# Patient Record
Sex: Male | Born: 1935
Health system: Southern US, Community
[De-identification: ages and names within clinical notes are randomized; demographics above are authoritative.]

## PROBLEM LIST (undated history)

## (undated) DIAGNOSIS — I5033 Acute on chronic diastolic (congestive) heart failure: Secondary | ICD-10-CM

## (undated) DIAGNOSIS — U071 COVID-19: Secondary | ICD-10-CM

## (undated) DIAGNOSIS — J9621 Acute and chronic respiratory failure with hypoxia: Secondary | ICD-10-CM

## (undated) DIAGNOSIS — J8410 Pulmonary fibrosis, unspecified: Secondary | ICD-10-CM

## (undated) DIAGNOSIS — I480 Paroxysmal atrial fibrillation: Secondary | ICD-10-CM

---

## 2019-10-18 ENCOUNTER — Other Ambulatory Visit (HOSPITAL_COMMUNITY): Payer: Medicare Other

## 2019-10-18 ENCOUNTER — Inpatient Hospital Stay
Admission: AD | Admit: 2019-10-18 | Discharge: 2019-12-15 | Disposition: E | Payer: Medicare Other | Source: Other Acute Inpatient Hospital | Attending: Internal Medicine | Admitting: Internal Medicine

## 2019-10-18 DIAGNOSIS — K567 Ileus, unspecified: Secondary | ICD-10-CM

## 2019-10-18 DIAGNOSIS — I509 Heart failure, unspecified: Secondary | ICD-10-CM

## 2019-10-18 DIAGNOSIS — I480 Paroxysmal atrial fibrillation: Secondary | ICD-10-CM | POA: Diagnosis present

## 2019-10-18 DIAGNOSIS — J189 Pneumonia, unspecified organism: Secondary | ICD-10-CM

## 2019-10-18 DIAGNOSIS — Z789 Other specified health status: Secondary | ICD-10-CM

## 2019-10-18 DIAGNOSIS — U071 COVID-19: Secondary | ICD-10-CM | POA: Diagnosis present

## 2019-10-18 DIAGNOSIS — R0602 Shortness of breath: Secondary | ICD-10-CM

## 2019-10-18 DIAGNOSIS — Z0189 Encounter for other specified special examinations: Secondary | ICD-10-CM

## 2019-10-18 DIAGNOSIS — R0902 Hypoxemia: Secondary | ICD-10-CM

## 2019-10-18 DIAGNOSIS — Z4659 Encounter for fitting and adjustment of other gastrointestinal appliance and device: Secondary | ICD-10-CM

## 2019-10-18 DIAGNOSIS — R109 Unspecified abdominal pain: Secondary | ICD-10-CM

## 2019-10-18 DIAGNOSIS — J9690 Respiratory failure, unspecified, unspecified whether with hypoxia or hypercapnia: Secondary | ICD-10-CM

## 2019-10-18 DIAGNOSIS — R52 Pain, unspecified: Secondary | ICD-10-CM

## 2019-10-18 DIAGNOSIS — J9621 Acute and chronic respiratory failure with hypoxia: Secondary | ICD-10-CM | POA: Diagnosis present

## 2019-10-18 DIAGNOSIS — I5033 Acute on chronic diastolic (congestive) heart failure: Secondary | ICD-10-CM | POA: Diagnosis present

## 2019-10-18 DIAGNOSIS — J8410 Pulmonary fibrosis, unspecified: Secondary | ICD-10-CM | POA: Diagnosis present

## 2019-10-18 HISTORY — DX: Acute and chronic respiratory failure with hypoxia: J96.21

## 2019-10-18 HISTORY — DX: COVID-19: U07.1

## 2019-10-18 HISTORY — DX: Pulmonary fibrosis, unspecified: J84.10

## 2019-10-18 HISTORY — DX: Paroxysmal atrial fibrillation: I48.0

## 2019-10-18 HISTORY — DX: Acute on chronic diastolic (congestive) heart failure: I50.33

## 2019-10-19 LAB — COMPREHENSIVE METABOLIC PANEL
ALT: 16 U/L (ref 0–44)
AST: 12 U/L — ABNORMAL LOW (ref 15–41)
Albumin: 2.5 g/dL — ABNORMAL LOW (ref 3.5–5.0)
Alkaline Phosphatase: 47 U/L (ref 38–126)
Anion gap: 10 (ref 5–15)
BUN: 19 mg/dL (ref 8–23)
CO2: 36 mmol/L — ABNORMAL HIGH (ref 22–32)
Calcium: 8.2 mg/dL — ABNORMAL LOW (ref 8.9–10.3)
Chloride: 93 mmol/L — ABNORMAL LOW (ref 98–111)
Creatinine, Ser: 0.73 mg/dL (ref 0.61–1.24)
GFR calc Af Amer: >60 mL/min (ref >60)
GFR calc non Af Amer: >60 mL/min (ref >60)
Glucose, Bld: 97 mg/dL (ref 70–99)
Potassium: 3.6 mmol/L (ref 3.5–5.1)
Sodium: 139 mmol/L (ref 135–145)
Total Bilirubin: 0.7 mg/dL (ref 0.3–1.2)
Total Protein: 4.9 g/dL — ABNORMAL LOW (ref 6.5–8.1)

## 2019-10-19 LAB — CBC WITH DIFFERENTIAL/PLATELET
Abs Immature Granulocytes: 0.08 10*3/uL — ABNORMAL HIGH (ref 0.00–0.07)
Basophils Absolute: 0 10*3/uL (ref 0.0–0.1)
Basophils Relative: 0 %
Eosinophils Absolute: 0.1 10*3/uL (ref 0.0–0.5)
Eosinophils Relative: 1 %
HCT: 40 % (ref 39.0–52.0)
Hemoglobin: 12.9 g/dL — ABNORMAL LOW (ref 13.0–17.0)
Immature Granulocytes: 1 %
Lymphocytes Relative: 11 %
Lymphs Abs: 1 10*3/uL (ref 0.7–4.0)
MCH: 30.1 pg (ref 26.0–34.0)
MCHC: 32.3 g/dL (ref 30.0–36.0)
MCV: 93.2 fL (ref 80.0–100.0)
Monocytes Absolute: 0.5 10*3/uL (ref 0.1–1.0)
Monocytes Relative: 5 %
Neutro Abs: 8.1 10*3/uL — ABNORMAL HIGH (ref 1.7–7.7)
Neutrophils Relative %: 82 %
Platelets: 284 10*3/uL (ref 150–400)
RBC: 4.29 MIL/uL (ref 4.22–5.81)
RDW: 14.7 % (ref 11.5–15.5)
WBC: 9.8 10*3/uL (ref 4.0–10.5)
nRBC: 0 % (ref 0.0–0.2)

## 2019-10-19 LAB — HEMOGLOBIN A1C
Hgb A1c MFr Bld: 6.9 % — ABNORMAL HIGH (ref 4.8–5.6)
Mean Plasma Glucose: 151.33 mg/dL

## 2019-10-19 LAB — TSH: TSH: 1.167 u[IU]/mL (ref 0.350–4.500)

## 2019-10-19 LAB — PHOSPHORUS: Phosphorus: 3.9 mg/dL (ref 2.5–4.6)

## 2019-10-19 LAB — PROTIME-INR
INR: 1.3 — ABNORMAL HIGH (ref 0.8–1.2)
Prothrombin Time: 15.8 seconds — ABNORMAL HIGH (ref 11.4–15.2)

## 2019-10-19 LAB — MAGNESIUM: Magnesium: 2.1 mg/dL (ref 1.7–2.4)

## 2019-10-19 MED ORDER — NYSTATIN 100000 UNIT/ML MT SUSP
500000.00 | OROMUCOSAL | Status: DC
Start: 2019-10-18 — End: 2019-10-19

## 2019-10-19 MED ORDER — DIGOXIN 125 MCG PO TABS
0.13 | ORAL_TABLET | ORAL | Status: DC
Start: 2019-10-18 — End: 2019-10-19

## 2019-10-19 MED ORDER — INSULIN LISPRO 100 UNIT/ML ~~LOC~~ SOLN
10.00 | SUBCUTANEOUS | Status: DC
Start: 2019-10-18 — End: 2019-10-19

## 2019-10-19 MED ORDER — PANTOPRAZOLE SODIUM 40 MG PO TBEC
40.00 | DELAYED_RELEASE_TABLET | ORAL | Status: DC
Start: 2019-10-18 — End: 2019-10-19

## 2019-10-19 MED ORDER — SIMETHICONE 40 MG/0.6ML PO SUSP
80.00 | ORAL | Status: DC
Start: ? — End: 2019-10-19

## 2019-10-19 MED ORDER — TORSEMIDE 10 MG PO TABS
40.00 | ORAL_TABLET | ORAL | Status: DC
Start: 2019-10-19 — End: 2019-10-19

## 2019-10-19 MED ORDER — NYSTATIN 100000 UNIT/GM EX POWD
CUTANEOUS | Status: DC
Start: 2019-10-18 — End: 2019-10-19

## 2019-10-19 MED ORDER — ACETAMINOPHEN 325 MG PO TABS
650.00 | ORAL_TABLET | ORAL | Status: DC
Start: ? — End: 2019-10-19

## 2019-10-19 MED ORDER — BISACODYL 10 MG RE SUPP
10.00 | RECTAL | Status: DC
Start: 2019-10-19 — End: 2019-10-19

## 2019-10-19 MED ORDER — SULFAMETHOXAZOLE-TRIMETHOPRIM 800-160 MG PO TABS
1.00 | ORAL_TABLET | ORAL | Status: DC
Start: 2019-10-19 — End: 2019-10-19

## 2019-10-19 MED ORDER — ASPIRIN 81 MG PO TBEC
81.00 | DELAYED_RELEASE_TABLET | ORAL | Status: DC
Start: 2019-10-19 — End: 2019-10-19

## 2019-10-19 MED ORDER — LIDOCAINE 4 % EX PTCH
1.00 | MEDICATED_PATCH | CUTANEOUS | Status: DC
Start: 2019-10-19 — End: 2019-10-19

## 2019-10-19 MED ORDER — INSULIN LISPRO 100 UNIT/ML ~~LOC~~ SOLN
2.00 | SUBCUTANEOUS | Status: DC
Start: 2019-10-18 — End: 2019-10-19

## 2019-10-19 MED ORDER — DEXTROSE 10 % IV SOLN
125.00 | INTRAVENOUS | Status: DC
Start: ? — End: 2019-10-19

## 2019-10-19 MED ORDER — GENERIC EXTERNAL MEDICATION
400.00 | Status: DC
Start: 2019-10-19 — End: 2019-10-19

## 2019-10-19 MED ORDER — INSULIN GLARGINE 100 UNIT/ML ~~LOC~~ SOLN
15.00 | SUBCUTANEOUS | Status: DC
Start: 2019-10-18 — End: 2019-10-19

## 2019-10-19 MED ORDER — GLUCOSE 40 % PO GEL
15.00 | ORAL | Status: DC
Start: ? — End: 2019-10-19

## 2019-10-19 MED ORDER — ALBUTEROL SULFATE (2.5 MG/3ML) 0.083% IN NEBU
2.50 | INHALATION_SOLUTION | RESPIRATORY_TRACT | Status: DC
Start: ? — End: 2019-10-19

## 2019-10-19 MED ORDER — CHOLECALCIFEROL 25 MCG (1000 UT) PO TABS
5000.00 | ORAL_TABLET | ORAL | Status: DC
Start: 2019-10-19 — End: 2019-10-19

## 2019-10-19 MED ORDER — APIXABAN 5 MG PO TABS
5.00 | ORAL_TABLET | ORAL | Status: DC
Start: 2019-10-18 — End: 2019-10-19

## 2019-10-19 MED ORDER — PHENYLEPHRINE-MINERAL OIL-PET 0.25-14-74.9 % RE OINT
TOPICAL_OINTMENT | RECTAL | Status: DC
Start: ? — End: 2019-10-19

## 2019-10-19 MED ORDER — PREDNISONE 10 MG PO TABS
40.00 | ORAL_TABLET | ORAL | Status: DC
Start: 2019-10-19 — End: 2019-10-19

## 2019-10-19 MED ORDER — ONDANSETRON HCL 4 MG PO TABS
4.00 | ORAL_TABLET | ORAL | Status: DC
Start: ? — End: 2019-10-19

## 2019-10-19 MED ORDER — AYR SALINE NASAL NA GEL
1.00 | NASAL | Status: DC
Start: ? — End: 2019-10-19

## 2019-10-19 MED ORDER — SENNOSIDES 8.6 MG PO TABS
17.20 | ORAL_TABLET | ORAL | Status: DC
Start: 2019-10-18 — End: 2019-10-19

## 2019-10-19 MED ORDER — MELATONIN 3 MG PO TABS
6.00 | ORAL_TABLET | ORAL | Status: DC
Start: 2019-10-18 — End: 2019-10-19

## 2019-10-19 MED ORDER — TRAZODONE HCL 50 MG PO TABS
50.00 | ORAL_TABLET | ORAL | Status: DC
Start: 2019-10-18 — End: 2019-10-19

## 2019-10-19 MED ORDER — THERA-M PO TABS
1.00 | ORAL_TABLET | ORAL | Status: DC
Start: 2019-10-19 — End: 2019-10-19

## 2019-10-19 MED ORDER — DIBUCAINE 1 % EX OINT
TOPICAL_OINTMENT | CUTANEOUS | Status: DC
Start: ? — End: 2019-10-19

## 2019-10-19 MED ORDER — GENERIC EXTERNAL MEDICATION
800.00 | Status: DC
Start: 2019-10-18 — End: 2019-10-19

## 2019-10-19 MED ORDER — DILTIAZEM HCL ER BEADS 180 MG PO CP24
180.00 | ORAL_CAPSULE | ORAL | Status: DC
Start: 2019-10-19 — End: 2019-10-19

## 2019-10-22 LAB — BASIC METABOLIC PANEL
Anion gap: 11 (ref 5–15)
BUN: 18 mg/dL (ref 8–23)
CO2: 35 mmol/L — ABNORMAL HIGH (ref 22–32)
Calcium: 8.3 mg/dL — ABNORMAL LOW (ref 8.9–10.3)
Chloride: 92 mmol/L — ABNORMAL LOW (ref 98–111)
Creatinine, Ser: 0.94 mg/dL (ref 0.61–1.24)
GFR calc Af Amer: >60 mL/min (ref >60)
GFR calc non Af Amer: >60 mL/min (ref >60)
Glucose, Bld: 79 mg/dL (ref 70–99)
Potassium: 3.6 mmol/L (ref 3.5–5.1)
Sodium: 138 mmol/L (ref 135–145)

## 2019-10-22 LAB — CBC
HCT: 44 % (ref 39.0–52.0)
Hemoglobin: 14.3 g/dL (ref 13.0–17.0)
MCH: 30.2 pg (ref 26.0–34.0)
MCHC: 32.5 g/dL (ref 30.0–36.0)
MCV: 93 fL (ref 80.0–100.0)
Platelets: 308 10*3/uL (ref 150–400)
RBC: 4.73 MIL/uL (ref 4.22–5.81)
RDW: 14.4 % (ref 11.5–15.5)
WBC: 9.8 10*3/uL (ref 4.0–10.5)
nRBC: 0 % (ref 0.0–0.2)

## 2019-10-24 ENCOUNTER — Other Ambulatory Visit (HOSPITAL_COMMUNITY): Payer: Medicare Other

## 2019-10-24 MED ORDER — IOHEXOL 300 MG/ML  SOLN
100.0000 mL | Freq: Once | INTRAMUSCULAR | Status: AC | PRN
  Administered 2019-10-24: 100 mL via INTRAVENOUS

## 2019-10-26 LAB — CBC
HCT: 41.4 % (ref 39.0–52.0)
Hemoglobin: 13.2 g/dL (ref 13.0–17.0)
MCH: 30 pg (ref 26.0–34.0)
MCHC: 31.9 g/dL (ref 30.0–36.0)
MCV: 94.1 fL (ref 80.0–100.0)
Platelets: 277 10*3/uL (ref 150–400)
RBC: 4.4 MIL/uL (ref 4.22–5.81)
RDW: 14.5 % (ref 11.5–15.5)
WBC: 10.4 10*3/uL (ref 4.0–10.5)
nRBC: 0 % (ref 0.0–0.2)

## 2019-10-26 LAB — BASIC METABOLIC PANEL
Anion gap: 11 (ref 5–15)
BUN: 19 mg/dL (ref 8–23)
CO2: 37 mmol/L — ABNORMAL HIGH (ref 22–32)
Calcium: 8.5 mg/dL — ABNORMAL LOW (ref 8.9–10.3)
Chloride: 88 mmol/L — ABNORMAL LOW (ref 98–111)
Creatinine, Ser: 0.98 mg/dL (ref 0.61–1.24)
GFR calc Af Amer: >60 mL/min (ref >60)
GFR calc non Af Amer: >60 mL/min (ref >60)
Glucose, Bld: 231 mg/dL — ABNORMAL HIGH (ref 70–99)
Potassium: 4.6 mmol/L (ref 3.5–5.1)
Sodium: 136 mmol/L (ref 135–145)

## 2019-10-26 LAB — MAGNESIUM: Magnesium: 2.5 mg/dL — ABNORMAL HIGH (ref 1.7–2.4)

## 2019-10-27 ENCOUNTER — Other Ambulatory Visit (HOSPITAL_COMMUNITY): Payer: Medicare Other

## 2019-11-02 ENCOUNTER — Other Ambulatory Visit (HOSPITAL_COMMUNITY): Payer: Medicare Other

## 2019-11-02 DIAGNOSIS — H6123 Impacted cerumen, bilateral: Secondary | ICD-10-CM | POA: Diagnosis not present

## 2019-11-02 DIAGNOSIS — H9113 Presbycusis, bilateral: Secondary | ICD-10-CM | POA: Diagnosis not present

## 2019-11-02 DIAGNOSIS — H6522 Chronic serous otitis media, left ear: Secondary | ICD-10-CM | POA: Diagnosis not present

## 2019-11-02 LAB — BLOOD GAS, ARTERIAL
Acid-Base Excess: 13 mmol/L — ABNORMAL HIGH (ref 0.0–2.0)
Bicarbonate: 37.5 mmol/L — ABNORMAL HIGH (ref 20.0–28.0)
FIO2: 44
O2 Saturation: 97.6 %
Patient temperature: 36
pCO2 arterial: 49.6 mmHg — ABNORMAL HIGH (ref 32.0–48.0)
pH, Arterial: 7.486 — ABNORMAL HIGH (ref 7.350–7.450)
pO2, Arterial: 98.2 mmHg (ref 83.0–108.0)

## 2019-11-02 LAB — URINALYSIS, ROUTINE W REFLEX MICROSCOPIC
Bilirubin Urine: NEGATIVE
Glucose, UA: >=500 mg/dL — AB
Hgb urine dipstick: NEGATIVE
Ketones, ur: NEGATIVE mg/dL
Nitrite: NEGATIVE
Protein, ur: NEGATIVE mg/dL
Specific Gravity, Urine: 1.012 (ref 1.005–1.030)
WBC, UA: >50 WBC/hpf — ABNORMAL HIGH (ref 0–5)
pH: 7 (ref 5.0–8.0)

## 2019-11-02 LAB — BASIC METABOLIC PANEL
Anion gap: 14 (ref 5–15)
BUN: 20 mg/dL (ref 8–23)
CO2: 37 mmol/L — ABNORMAL HIGH (ref 22–32)
Calcium: 8.8 mg/dL — ABNORMAL LOW (ref 8.9–10.3)
Chloride: 85 mmol/L — ABNORMAL LOW (ref 98–111)
Creatinine, Ser: 0.95 mg/dL (ref 0.61–1.24)
GFR calc Af Amer: >60 mL/min (ref >60)
GFR calc non Af Amer: >60 mL/min (ref >60)
Glucose, Bld: 293 mg/dL — ABNORMAL HIGH (ref 70–99)
Potassium: 3.4 mmol/L — ABNORMAL LOW (ref 3.5–5.1)
Sodium: 136 mmol/L (ref 135–145)

## 2019-11-02 LAB — CBC
HCT: 45.1 % (ref 39.0–52.0)
Hemoglobin: 14.4 g/dL (ref 13.0–17.0)
MCH: 30.5 pg (ref 26.0–34.0)
MCHC: 31.9 g/dL (ref 30.0–36.0)
MCV: 95.6 fL (ref 80.0–100.0)
Platelets: 204 10*3/uL (ref 150–400)
RBC: 4.72 MIL/uL (ref 4.22–5.81)
RDW: 15.9 % — ABNORMAL HIGH (ref 11.5–15.5)
WBC: 14.6 10*3/uL — ABNORMAL HIGH (ref 4.0–10.5)
nRBC: 0 % (ref 0.0–0.2)

## 2019-11-02 LAB — BRAIN NATRIURETIC PEPTIDE: B Natriuretic Peptide: 79.7 pg/mL (ref 0.0–100.0)

## 2019-11-02 LAB — NOVEL CORONAVIRUS, NAA (HOSP ORDER, SEND-OUT TO REF LAB; TAT 18-24 HRS): SARS-CoV-2, NAA: NOT DETECTED

## 2019-11-05 ENCOUNTER — Other Ambulatory Visit (HOSPITAL_COMMUNITY): Payer: Medicare Other

## 2019-11-05 LAB — URINE CULTURE: Culture: >=100000 — AB

## 2019-11-06 LAB — BRAIN NATRIURETIC PEPTIDE: B Natriuretic Peptide: 175 pg/mL — ABNORMAL HIGH (ref 0.0–100.0)

## 2019-11-07 ENCOUNTER — Other Ambulatory Visit (HOSPITAL_COMMUNITY): Payer: Medicare Other

## 2019-11-08 LAB — BASIC METABOLIC PANEL
Anion gap: 11 (ref 5–15)
BUN: 26 mg/dL — ABNORMAL HIGH (ref 8–23)
CO2: 34 mmol/L — ABNORMAL HIGH (ref 22–32)
Calcium: 8.1 mg/dL — ABNORMAL LOW (ref 8.9–10.3)
Chloride: 97 mmol/L — ABNORMAL LOW (ref 98–111)
Creatinine, Ser: 1.04 mg/dL (ref 0.61–1.24)
GFR calc Af Amer: >60 mL/min (ref >60)
GFR calc non Af Amer: >60 mL/min (ref >60)
Glucose, Bld: 237 mg/dL — ABNORMAL HIGH (ref 70–99)
Potassium: 3.2 mmol/L — ABNORMAL LOW (ref 3.5–5.1)
Sodium: 142 mmol/L (ref 135–145)

## 2019-11-09 ENCOUNTER — Other Ambulatory Visit (HOSPITAL_COMMUNITY): Payer: Medicare Other

## 2019-11-09 LAB — POTASSIUM: Potassium: 3 mmol/L — ABNORMAL LOW (ref 3.5–5.1)

## 2019-11-09 NOTE — Progress Notes (Signed)
  Echocardiogram 2D Echocardiogram has been performed.  Delcie Roch 11/09/2019, 5:40 PM

## 2019-11-10 NOTE — Consult Note (Signed)
Referring Physician: Dr. Mariah Milling  Karl Mason is an 86 y.o. male.                       Chief Complaint: CAD, atrial fibrillation and pulmonary HTN   HPI: 86 years old white male with PMH of COVID-19 pneumonia, type 2 DM, CAD, Pulmonary fibrosis with pulmonary hypertension, atonic bladder, ascending aortic aneurysm and chronic atrial fibrillation has acute on chronic respiratory distress. He is now on 4 L/Min Oxygen by nasal cannula. His echocardiogram yesterday showed preserved LV systolic function, moderate RV systolic dysfunction, mild AI, MR and severe TR.   Past medical history: As in HPI. NYHA class 4.   PSH: Cardiac catheterization  Family history : Positive for type 2 Dm,HTN,lung cancer to a brother. CHF to father.  Social History:  has no history on of smoking tobacco, alcohol use or drug use.  Allergies: Lisinopril, Statins and hyperglycemia with prednisone use.  No medications prior to admission.  Prednisone, tapering dose  Multivitamin daily. Torsemide 40 mg. One PO daily. Diltiazem 90 mg. One PO bid. Bactrim  Daily. Trazodone 50 mg. One PO q hs. Miralax 17 gm daily. Protonix 40 mg. One twice daily. Melatonin 6 mg. One PO q HS. Lantus insulin 15 units SQ q HS. Neurontin 800 mg. q Hs and 400 mg. In AM Digoxin 0.125 mg. One daily. Vitamin D 2000 units daily, Aspirin 81 mg. Daily. Eliquis 5 mg. One twice daily.  Results for orders placed or performed during the hospital encounter of 10/29/2019 (from the past 48 hour(s))  Potassium     Status: Abnormal   Collection Time: 11/09/19  6:29 AM  Result Value Ref Range   Potassium 3.0 (L) 3.5 - 5.1 mmol/L    Comment: Performed at [facility] [address], [place], [place] [address]   ECHOCARDIOGRAM COMPLETE  Result Date: 11/09/2019    ECHOCARDIOGRAM REPORT   Patient Name:   Karl Mason Date of Exam: 11/09/2019 Medical Rec #:  [id number]     Height: Accession #:    [id number]    Weight: Date of Birth:  1933-06-20       BSA: Patient Age:    86 years      BP:           130/73 mmHg Patient Gender: M             HR:           78 bpm. 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  Sonographer:     Johny Chess RDCS Referring Phys:  [id number] [place] Diagnosing Phys: Dixie Dials MD IMPRESSIONS  1. Left ventricular ejection fraction, by estimation, is 50 to 55%. The left ventricle has low normal function. The left ventricle demonstrates regional wall motion abnormalities (see scoring diagram/findings for description). Indeterminate diastolic filling due to E-A fusion. There is mild hypokinesis of the left ventricular, basal-mid inferolateral wall. There is mild hypokinesis of the left ventricular, basal septal wall.  2. Right ventricular systolic function is moderately reduced. The right ventricular size is mildly enlarged. mildly increased right ventricular wall thickness. There is moderately elevated pulmonary artery systolic pressure.  3. Left atrial size was mildly dilated.  4. Can not r/o MV vegetation. The mitral valve is degenerative. Mild mitral  valve regurgitation.  5. Tricuspid valve regurgitation is severe.  6. Left coronary cusp is smaller compared to non-coronary cusp. The aortic valve is tricuspid. Aortic valve regurgitation is moderate.  7. There is mild (Grade II) atheroma plaque involving the ascending aorta.  8. The inferior vena cava is normal in size with <50% respiratory variability, suggesting right atrial pressure of 8 mmHg. FINDINGS  Left Ventricle: Left ventricular ejection fraction, by estimation, is 50 to 55%. The left ventricle has low normal function. The left ventricle demonstrates regional wall motion abnormalities. Mild hypokinesis of the left ventricular, basal-mid inferolateral wall. Mild hypokinesis of the left ventricular, basal septal  wall. The left ventricular internal cavity size was normal in size. There is borderline concentric left ventricular hypertrophy. Indeterminate diastolic filling due to E-A fusion.  LV Wall Scoring: The posterior wall, basal anterolateral segment, and basal inferoseptal segment are hypokinetic. The entire anterior wall, entire anterior septum, entire apex, entire inferior wall, mid anterolateral segment, and mid inferoseptal segment are normal. Right Ventricle: The right ventricular size is mildly enlarged. Mildly increased right ventricular wall thickness. Right ventricular systolic function is moderately reduced. There is moderately elevated pulmonary artery systolic pressure. The tricuspid regurgitant velocity is 3.81 m/s, and with an assumed right atrial pressure of 8 mmHg, the estimated right ventricular systolic pressure is 66.1 mmHg. Left Atrium: Left atrial size was mildly dilated. Right Atrium: Right atrial size was normal in size. Pericardium: There is no evidence of pericardial effusion. Mitral Valve: Can not r/o MV vegetation. The mitral valve is degenerative in appearance. There is mild thickening of the posterior mitral valve leaflet(s). There is moderate calcification of the posterior mitral valve leaflet(s). Mildly decreased mobility of the mitral valve leaflets. Moderate mitral annular calcification. Mild mitral valve regurgitation. Tricuspid Valve: The tricuspid valve is normal in structure. Tricuspid valve regurgitation is severe. Aortic Valve: Left coronary cusp is smaller compared to non-coronary cusp. The aortic valve is tricuspid. . There is mild thickening and mild calcification of the aortic valve. Aortic valve regurgitation is moderate. Aortic regurgitation PHT measures 368  msec. Moderate aortic valve annular calcification. There is mild thickening of the aortic valve. There is mild calcification of the aortic valve. Pulmonic Valve: The pulmonic valve was normal in structure. Pulmonic valve  regurgitation is not visualized. Aorta: The aortic root is normal in size and structure. There is mild (Grade II) atheroma plaque involving the ascending aorta. Venous: The inferior vena cava is normal in size with less than 50% respiratory variability, suggesting right atrial pressure of 8 mmHg. IAS/Shunts: No atrial level shunt detected by color flow Doppler.  LEFT VENTRICLE PLAX 2D LVIDd:         4.50 cm LVIDs:         3.40 cm LV PW:         1.10 cm LV IVS:        1.00 cm LVOT diam:     2.00 cm LV SV:         58 LVOT Area:     3.14 cm  RIGHT VENTRICLE TAPSE (M-mode): 1.4 cm LEFT ATRIUM             RIGHT ATRIUM LA diam:        2.60 cm RA Area:     12.10 cm LA Vol (A2C):   41.7 ml RA Volume:   24.50 ml LA Vol (A4C):   37.4 ml LA Biplane Vol: 43.5 ml  AORTIC VALVE LVOT Vmax:   109.00  cm/s LVOT Vmean:  68.100 cm/s LVOT VTI:    0.186 m AI PHT:      368 msec TRICUSPID VALVE TR Peak grad:   58.1 mmHg TR Vmax:        381.00 cm/s  SHUNTS Systemic VTI:  0.19 m Systemic Diam: 2.00 cm Orpah Cobb MD Electronically signed by Orpah Cobb MD Signature Date/Time: 11/09/2019/7:28:47 PM    Final     Review Of Systems Constitutional: H/o fever, chills, Chronic weight gain. Eyes: No vision change, wears glasses. No discharge or pain. Ears: Positive hearing loss, No tinnitus. Respiratory: No asthma, COPD, Positive pulmonary fibrosis, pneumonias, shortness of breath. No hemoptysis. Cardiovascular: Positive chest pain, palpitation, leg edema. Gastrointestinal: No nausea, vomiting, diarrhea, constipation. No GI bleed. No hepatitis. Genitourinary: No dysuria, hematuria, kidney stone. No incontinance. Neurological: No headache, stroke, seizures.  Psychiatry: No psych facility admission for anxiety, depression, suicide. No detox. Skin: No rash. Musculoskeletal: Positive joint pain, fibromyalgia, neck pain, back pain. Lymphadenopathy: No lymphadenopathy. Hematology: No anemia or easy bruising.  P: 100, R: 28 BP:  120/60. There were no vitals taken for this visit. There is no height or weight on file to calculate BMI. General appearance: alert, cooperative, appears stated age and moderate respiratory  Distress at rest. Head: Normocephalic, atraumatic. Eyes: Blue eyes, pink conjunctiva, corneas clear. PERRL, EOM's intact. Neck: No adenopathy, no carotid bruit, no JVD, supple, symmetrical, trachea midline and thyroid not enlarged. Resp: Basal crackles to auscultation bilaterally. Cardio: Irreegular rate and rhythm, S1, S2 normal, II/VI systolic murmur, no click, rub or gallop GI: Soft, non-tender; bowel sounds normal; no organomegaly. Extremities: No edema, cyanosis or clubbing. Skin: Warm and dry.  Neurologic: Alert and oriented X 3, normal strength.   Assessment/Plan Acute on chronic respiratory failure S/P COVID 19 pneumonia Pulmonary fibrosis  Moderate pulmonary hypertension Chronic atrial fibrillation, CHA2DS2VASc score of 6 Type 2 DM Ascending aortic aneurysm Mild AI and MR Severe TR Atonic bladder with indwelling catheter Chronic UTI  Plan: Continue diltiazem for heart rate control and pulmonary hypertension. Not a candidate for amiodarone due to pulmonary fibrosis. Will try small dose Revatio 20 mg. 3 times daily for pulmonary fibrosis with hypertension. Continue 24 hour chronic oxygen therapy for pulmonary fibrosis. Increase activity as tolerated.  Time spent: Review of old records, Lab, x-rays, EKG, other cardiac tests, examination, discussion with patient over 70 minutes.  Ricki Rodriguez, MD  11/10/2019, 2:43 PM

## 2019-11-11 ENCOUNTER — Other Ambulatory Visit (HOSPITAL_COMMUNITY): Payer: Medicare Other

## 2019-11-11 LAB — BLOOD GAS, ARTERIAL
Acid-Base Excess: 10.4 mmol/L — ABNORMAL HIGH (ref 0.0–2.0)
Bicarbonate: 34.6 mmol/L — ABNORMAL HIGH (ref 20.0–28.0)
FIO2: 70
O2 Saturation: 96.4 %
Patient temperature: 35.7
pCO2 arterial: 45.4 mmHg (ref 32.0–48.0)
pH, Arterial: 7.488 — ABNORMAL HIGH (ref 7.350–7.450)
pO2, Arterial: 75.5 mmHg — ABNORMAL LOW (ref 83.0–108.0)

## 2019-11-11 LAB — CBC
HCT: 43 % (ref 39.0–52.0)
Hemoglobin: 13.5 g/dL (ref 13.0–17.0)
MCH: 30.3 pg (ref 26.0–34.0)
MCHC: 31.4 g/dL (ref 30.0–36.0)
MCV: 96.4 fL (ref 80.0–100.0)
Platelets: 250 10*3/uL (ref 150–400)
RBC: 4.46 MIL/uL (ref 4.22–5.81)
RDW: 16.4 % — ABNORMAL HIGH (ref 11.5–15.5)
WBC: 11.7 10*3/uL — ABNORMAL HIGH (ref 4.0–10.5)
nRBC: 0.2 % (ref 0.0–0.2)

## 2019-11-11 LAB — BASIC METABOLIC PANEL
Anion gap: 9 (ref 5–15)
BUN: 25 mg/dL — ABNORMAL HIGH (ref 8–23)
CO2: 39 mmol/L — ABNORMAL HIGH (ref 22–32)
Calcium: 8.6 mg/dL — ABNORMAL LOW (ref 8.9–10.3)
Chloride: 102 mmol/L (ref 98–111)
Creatinine, Ser: 0.89 mg/dL (ref 0.61–1.24)
GFR calc Af Amer: >60 mL/min (ref >60)
GFR calc non Af Amer: >60 mL/min (ref >60)
Glucose, Bld: 225 mg/dL — ABNORMAL HIGH (ref 70–99)
Potassium: 3.9 mmol/L (ref 3.5–5.1)
Sodium: 150 mmol/L — ABNORMAL HIGH (ref 135–145)

## 2019-11-12 ENCOUNTER — Other Ambulatory Visit (HOSPITAL_COMMUNITY): Payer: Medicare Other

## 2019-11-12 ENCOUNTER — Encounter: Payer: Self-pay | Admitting: Internal Medicine

## 2019-11-12 DIAGNOSIS — I480 Paroxysmal atrial fibrillation: Secondary | ICD-10-CM | POA: Diagnosis not present

## 2019-11-12 DIAGNOSIS — J8410 Pulmonary fibrosis, unspecified: Secondary | ICD-10-CM | POA: Diagnosis present

## 2019-11-12 DIAGNOSIS — U071 COVID-19: Secondary | ICD-10-CM | POA: Diagnosis present

## 2019-11-12 DIAGNOSIS — I5033 Acute on chronic diastolic (congestive) heart failure: Secondary | ICD-10-CM | POA: Diagnosis present

## 2019-11-12 DIAGNOSIS — J9621 Acute and chronic respiratory failure with hypoxia: Secondary | ICD-10-CM | POA: Diagnosis present

## 2019-11-12 LAB — BASIC METABOLIC PANEL
Anion gap: 11 (ref 5–15)
BUN: 25 mg/dL — ABNORMAL HIGH (ref 8–23)
CO2: 35 mmol/L — ABNORMAL HIGH (ref 22–32)
Calcium: 8 mg/dL — ABNORMAL LOW (ref 8.9–10.3)
Chloride: 97 mmol/L — ABNORMAL LOW (ref 98–111)
Creatinine, Ser: 0.84 mg/dL (ref 0.61–1.24)
GFR calc Af Amer: >60 mL/min (ref >60)
GFR calc non Af Amer: >60 mL/min (ref >60)
Glucose, Bld: 255 mg/dL — ABNORMAL HIGH (ref 70–99)
Potassium: 4.2 mmol/L (ref 3.5–5.1)
Sodium: 143 mmol/L (ref 135–145)

## 2019-11-12 NOTE — Consult Note (Signed)
Pulmonary Critical Care Medicine [facility] [place]  PULMONARY SERVICE  Date of Service: 11/12/2019  PULMONARY CRITICAL CARE CONSULT   Karl Mason  DXA:[id number]  DOB: 1932-12-13   DOA: 10/22/19  Referring Physician: Carron Curie, MD  HPI: Karl Mason is a 86 y.o. male seen for follow up of Acute on Chronic Respiratory Failure.  Patient has multiple medical problems including COPD diabetes mellitus atrial fibrillation hyperlipidemia coronary artery disease pulmonary fibrosis who presented to the hospital on chronic oxygen at 3 L at home residing in a skilled nursing facility with complaints of increasing shortness of breath.  Patient was noted at that time to have increased swelling of the legs also.  Apparently patient's oxygen level was increased to 5 L with some improvement however he has had continued worsening of his oxygen requirements and now is on 40 L and 75% of high flow oxygen.  He has basically had a steady decline in his status with his history of pulmonary fibrosis.  Has had an echocardiogram done past which revealed an ejection fraction of 55 to 60% so he has basically heart failure with preserved ejection fraction.  His heart rate was under control as far as the atrial fibrillation is concerned has been on Eliquis Cardizem Tikosyn.  I am asked to see the patient regarding his pulmonary fibrosis  Review of Systems:  ROS performed and is unremarkable other than noted above.  History  Past Medical and Surgical History Past Medical History:  Diagnosis Date  . A-fib (HCC)  . Abnormal heart rhythm  . Bronchiectasis with acute exacerbation (HCC) 08/21/2018  . Diabetes mellitus (HCC)  . Hearing loss  . Heart attack (HCC)  . Heart block  . Heart failure (HCC)  . High cholesterol  . Myocardial infarction (HCC)  . Pulmonary fibrosis (HCC)  . Sleep apnea  . Urinary retention 07/24/2019   Past Surgical History:  Procedure Laterality Date  . ESOPHAGOSCOPY / EGD N/A  07/03/2019  Procedure: EGD; Surgeon: Willene Hatchet, MD; Location: [facility] ENDO OR; Service: Gastroenterology; Laterality: N/A;  . STENT PLACEMENT   Family History Family History  Problem Relation Age of Onset  . Heart disease Father  . Heart attack Father  . Cancer Father  . Hearing loss Father  . Hypertension Brother  . Diabetes Brother  . Osteoporosis Paternal Uncle  . Stroke Sister  . Clotting disorder Neg Hx  . Allergic rhinitis Neg Hx  . Migraines Neg Hx   Social History Social History   Tobacco Use  . Smoking status: Never Smoker  . Smokeless tobacco: Never Used  Substance and Sexual Activity  . Alcohol use: Not Currently  . Drug use: Never    Medications: Reviewed on Rounds  Physical Exam:  Vitals: Temperature 96.4 pulse 97 respiratory rate 13 blood pressure was 108/76 saturations 95%  Ventilator Settings off the ventilator on 40 L FiO2 at 75%  . General: Comfortable at this time . Eyes: Grossly normal lids, irises & conjunctiva . ENT: grossly tongue is normal . Neck: no obvious mass . Cardiovascular: S1-S2 normal no gallop or rub . Respiratory: No rhonchi no rales are noted at this time . Abdomen: Soft nontender . Skin: no rash seen on limited exam . Musculoskeletal: not rigid . Psychiatric:unable to assess . Neurologic: no seizure no involuntary movements         Labs on Admission:  Basic Metabolic Panel: Recent Labs  Lab 11/08/19 0453 11/09/19 2094 11/11/19 7096 11/12/19 [phone]  NA 142  --  150* 143  K 3.2* 3.0* 3.9 4.2  CL 97*  --  102 97*  CO2 34*  --  39* 35*  GLUCOSE 237*  --  225* 255*  BUN 26*  --  25* 25*  CREATININE 1.04  --  0.89 0.84  CALCIUM 8.1*  --  8.6* 8.0*    Recent Labs  Lab 11/11/19 1050  PHART 7.488*  PCO2ART 45.4  PO2ART 75.5*  HCO3 34.6*  O2SAT 96.4    Liver Function Tests: No results for input(s): AST, ALT, ALKPHOS, BILITOT, PROT, ALBUMIN in the last 168 hours. No results for input(s): LIPASE,  AMYLASE in the last 168 hours. No results for input(s): AMMONIA in the last 168 hours.  CBC: Recent Labs  Lab 11/11/19 0807  WBC 11.7*  HGB 13.5  HCT 43.0  MCV 96.4  PLT 250    Cardiac Enzymes: No results for input(s): CKTOTAL, CKMB, CKMBINDEX, TROPONINI in the last 168 hours.  BNP (last 3 results) Recent Labs    11/02/19 1224 11/06/19 1254  BNP 79.7 175.0*    ProBNP (last 3 results) No results for input(s): PROBNP in the last 8760 hours.   Radiological Exams on Admission: DG Chest Port 1 View  Result Date: 11/11/2019 CLINICAL DATA:  Hypoxia EXAM: PORTABLE CHEST 1 VIEW COMPARISON:  November 02, 2019 FINDINGS: The mediastinal contour and cardiac silhouette are stable. Heart size is enlarged. Diffuse increased pulmonary interstitium is identified in both lungs unchanged. Interval developed consolidation with air bronchogram of the left lung base is noted. No acute abnormality is identified in the osseous structures. IMPRESSION: 1. Interval developed consolidation of left lung base, consistent with pneumonia. 2. Diffuse increased pulmonary interstitium unchanged. Electronically Signed   By: Sherian Rein M.D.   On: 11/11/2019 13:12   ECHOCARDIOGRAM COMPLETE  Result Date: 11/09/2019    ECHOCARDIOGRAM REPORT   Patient Name:   Karl Mason Date of Exam: 11/09/2019 Medical Rec #:  [id number]     Height: Accession #:    [id number]    Weight: Date of Birth:  March 15, 1933      BSA: Patient Age:    87 years      BP:           130/73 mmHg Patient Gender: M             HR:           78 bpm. 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  Sonographer:     Delcie Roch RDCS Referring Phys:  [id number] Florian Buff [place] Diagnosing Phys: Orpah Cobb MD IMPRESSIONS  1. Left ventricular ejection fraction, by estimation, is 50 to 55%. The left ventricle has low  normal function. The left ventricle demonstrates regional wall motion abnormalities (see scoring diagram/findings for description). Indeterminate diastolic filling due to E-A fusion. There is mild hypokinesis of the left ventricular, basal-mid inferolateral wall. There is mild hypokinesis of the left ventricular, basal septal wall.  2. Right ventricular systolic function is moderately reduced. The right ventricular size is mildly enlarged. mildly increased right ventricular wall thickness. There is moderately elevated pulmonary artery systolic pressure.  3. Left atrial size was mildly dilated.  4. Can not r/o MV vegetation. The mitral valve is degenerative. Mild mitral valve regurgitation.  5. Tricuspid valve regurgitation is severe.  6. Left coronary cusp is smaller compared to non-coronary cusp. The aortic valve is tricuspid. Aortic valve regurgitation is moderate.  7. There is mild (Grade II) atheroma plaque involving the ascending aorta.  8. The inferior vena cava is normal in size with <50% respiratory variability, suggesting right atrial pressure of 8 mmHg. FINDINGS  Left Ventricle: Left ventricular ejection fraction, by estimation, is 50 to 55%. The left ventricle has low normal function. The left ventricle demonstrates regional wall motion abnormalities. Mild hypokinesis of the left ventricular, basal-mid inferolateral wall. Mild hypokinesis of the left ventricular, basal septal wall. The left ventricular internal cavity size was normal in size. There is borderline concentric left ventricular hypertrophy. Indeterminate diastolic filling due to E-A fusion.  LV Wall Scoring: The posterior wall, basal anterolateral segment, and basal inferoseptal segment are hypokinetic. The entire anterior wall, entire anterior septum, entire apex, entire inferior wall, mid anterolateral segment, and mid inferoseptal segment are normal. Right Ventricle: The right ventricular size is mildly enlarged. Mildly increased right  ventricular wall thickness. Right ventricular systolic function is moderately reduced. There is moderately elevated pulmonary artery systolic pressure. The tricuspid regurgitant velocity is 3.81 m/s, and with an assumed right atrial pressure of 8 mmHg, the estimated right ventricular systolic pressure is 66.1 mmHg. Left Atrium: Left atrial size was mildly dilated. Right Atrium: Right atrial size was normal in size. Pericardium: There is no evidence of pericardial effusion. Mitral Valve: Can not r/o MV vegetation. The mitral valve is degenerative in appearance. There is mild thickening of the posterior mitral valve leaflet(s). There is moderate calcification of the posterior mitral valve leaflet(s). Mildly decreased mobility of the mitral valve leaflets. Moderate mitral annular calcification. Mild mitral valve regurgitation. Tricuspid Valve: The tricuspid valve is normal in structure. Tricuspid valve regurgitation is severe. Aortic Valve: Left coronary cusp is smaller compared to non-coronary cusp. The aortic valve is tricuspid. . There is mild thickening and mild calcification of the aortic valve. Aortic valve regurgitation is moderate. Aortic regurgitation PHT measures 368  msec. Moderate aortic valve annular calcification. There is mild thickening of the aortic valve. There is mild calcification of the aortic valve. Pulmonic Valve: The pulmonic valve was normal in structure. Pulmonic valve regurgitation is not visualized. Aorta: The aortic root is normal in size and structure. There is mild (Grade II) atheroma plaque involving the ascending aorta. Venous: The inferior vena cava is normal in size with less than 50% respiratory variability, suggesting right atrial pressure of 8 mmHg. IAS/Shunts: No atrial level shunt detected by color flow Doppler.  LEFT VENTRICLE PLAX 2D LVIDd:         4.50 cm LVIDs:         3.40 cm LV PW:         1.10 cm LV IVS:        1.00 cm LVOT diam:     2.00 cm LV SV:         58 LVOT Area:      3.14 cm  RIGHT VENTRICLE TAPSE (M-mode): 1.4 cm LEFT ATRIUM             RIGHT ATRIUM LA diam:        2.60 cm RA Area:     12.10 cm LA Vol (A2C):   41.7 ml RA Volume:   24.50 ml LA Vol (A4C):   37.4 ml LA Biplane Vol: 43.5 ml  AORTIC VALVE LVOT Vmax:   109.00 cm/s LVOT Vmean:  68.100 cm/s LVOT VTI:  0.186 m AI PHT:      368 msec TRICUSPID VALVE TR Peak grad:   58.1 mmHg TR Vmax:        381.00 cm/s  SHUNTS Systemic VTI:  0.19 m Systemic Diam: 2.00 cm Dixie Dials MD Electronically signed by Dixie Dials MD Signature Date/Time: 11/09/2019/7:28:47 PM    Final     Assessment/Plan Active Problems:   Acute on chronic respiratory failure with hypoxia (HCC)   Pulmonary fibrosis, postinflammatory (HCC)   Acute on chronic heart failure with preserved ejection fraction ([place])   COVID-19 virus infection   Paroxysmal atrial fibrillation ([place])   1. Acute on chronic respiratory failure with hypoxia patient has severe underlying pulmonary fibrosis I personally reviewed the CT scan and it shows severe chronic interstitial lung disease with groundglass opacities and fibrotic changes which are irreversible.  Oxygen requirements have basically been going worse and he has been having more frequent admissions to the hospital. 2. Pulmonary fibrosis appears to have pretty significant severe disease has been regularly following with pulmonology as an outpatient.  I think we do need to have conversation with the patient's family regarding the overall prognosis. 3. Acute on chronic heart failure with preserved ejection fraction patient is going to be continued to be monitored as far as the fluid status is concerned. 4. COVID-19 virus infection this is in resolution phase plan is going to think and to closely. 5. Paroxysmal atrial fibrillation rate is controlled we will continue with his anticoagulation as well as medical management  I have personally seen and evaluated the patient, evaluated laboratory and imaging results,  formulated the assessment and plan and placed orders. The Patient requires high complexity decision making with multiple systems involvement.  Case was discussed on Rounds with the Respiratory Therapy Director and the Respiratory staff Time Spent 61minutes  Seymone Forlenza A Milen Lengacher, MD [facility] Pulmonary Critical Care Medicine Sleep Medicine

## 2019-11-13 DIAGNOSIS — J9621 Acute and chronic respiratory failure with hypoxia: Secondary | ICD-10-CM | POA: Diagnosis not present

## 2019-11-13 DIAGNOSIS — I5033 Acute on chronic diastolic (congestive) heart failure: Secondary | ICD-10-CM | POA: Diagnosis not present

## 2019-11-13 DIAGNOSIS — I480 Paroxysmal atrial fibrillation: Secondary | ICD-10-CM | POA: Diagnosis not present

## 2019-11-13 DIAGNOSIS — U071 COVID-19: Secondary | ICD-10-CM | POA: Diagnosis not present

## 2019-11-13 LAB — URINALYSIS, ROUTINE W REFLEX MICROSCOPIC
Bilirubin Urine: NEGATIVE
Glucose, UA: 150 mg/dL — AB
Ketones, ur: NEGATIVE mg/dL
Leukocytes,Ua: NEGATIVE
Nitrite: NEGATIVE
Protein, ur: NEGATIVE mg/dL
Specific Gravity, Urine: 1.008 (ref 1.005–1.030)
pH: 7 (ref 5.0–8.0)

## 2019-11-13 LAB — CBC
HCT: 41.7 % (ref 39.0–52.0)
Hemoglobin: 13 g/dL (ref 13.0–17.0)
MCH: 30 pg (ref 26.0–34.0)
MCHC: 31.2 g/dL (ref 30.0–36.0)
MCV: 96.1 fL (ref 80.0–100.0)
Platelets: 221 10*3/uL (ref 150–400)
RBC: 4.34 MIL/uL (ref 4.22–5.81)
RDW: 16.5 % — ABNORMAL HIGH (ref 11.5–15.5)
WBC: 10 10*3/uL (ref 4.0–10.5)
nRBC: 0.4 % — ABNORMAL HIGH (ref 0.0–0.2)

## 2019-11-13 NOTE — Progress Notes (Signed)
Pulmonary Critical Care Medicine [facility] [place]   PULMONARY CRITICAL CARE SERVICE  PROGRESS NOTE  Date of Service: 11/13/2019  LINZIE Mason  ZOX:[id number]  DOB: 30-Jan-1933   DOA: 10/24/2019  Referring Physician: Carron Curie, MD  HPI: Karl Mason is a 86 y.o. male seen for follow up of Acute on Chronic Respiratory Failure.  Patient at this time is on 40 L oxygen flow rate and is on 85% FiO2.  The patient and I as well as the 2 daughters had a meeting along with the care team to discuss the patient's prognosis and outcomes.  After very prolonged discussion with the patient himself made it clear that he would like to be coded he would like to be intubated and placed on mechanical ventilation if it comes to that.  I explained to him that his condition is slowly deteriorating and his prognosis is not good overall.  Patient states that he would not however like to have a tracheostomy so therefore if he is on the ventilator for around the 2-week mark he would at that point want to be taken off the vent.  Medications: Reviewed on Rounds  Physical Exam:  Vitals: Temperature is 96.0 pulse 89 respiratory rate 30 blood pressure is 119/63 saturations 98%  Ventilator Settings off the ventilator on 40 L oxygen high flow with an FiO2 of 85%  . General: Comfortable at this time . Eyes: Grossly normal lids, irises & conjunctiva . ENT: grossly tongue is normal . Neck: no obvious mass . Cardiovascular: S1 S2 normal no gallop . Respiratory: Coarse rhonchi noted . Abdomen: soft . Skin: no rash seen on limited exam . Musculoskeletal: not rigid . Psychiatric:unable to assess . Neurologic: no seizure no involuntary movements         Lab Data:   Basic Metabolic Panel: Recent Labs  Lab 11/08/19 0453 11/09/19 0629 11/11/19 0807 11/12/19 0659  NA 142  --  150* 143  K 3.2* 3.0* 3.9 4.2  CL 97*  --  102 97*  CO2 34*  --  39* 35*  GLUCOSE 237*  --  225* 255*  BUN 26*  --  25* 25*   CREATININE 1.04  --  0.89 0.84  CALCIUM 8.1*  --  8.6* 8.0*    ABG: Recent Labs  Lab 11/11/19 1050  PHART 7.488*  PCO2ART 45.4  PO2ART 75.5*  HCO3 34.6*  O2SAT 96.4    Liver Function Tests: No results for input(s): AST, ALT, ALKPHOS, BILITOT, PROT, ALBUMIN in the last 168 hours. No results for input(s): LIPASE, AMYLASE in the last 168 hours. No results for input(s): AMMONIA in the last 168 hours.  CBC: Recent Labs  Lab 11/11/19 0807 11/13/19 0635  WBC 11.7* 10.0  HGB 13.5 13.0  HCT 43.0 41.7  MCV 96.4 96.1  PLT 250 221    Cardiac Enzymes: No results for input(s): CKTOTAL, CKMB, CKMBINDEX, TROPONINI in the last 168 hours.  BNP (last 3 results) Recent Labs    11/02/19 1224 11/06/19 1254  BNP 79.7 175.0*    ProBNP (last 3 results) No results for input(s): PROBNP in the last 8760 hours.  Radiological Exams: US Abdomen Limited RUQ  Result Date: 11/12/2019 CLINICAL DATA:  Abdominal pain for 1 night EXAM: ULTRASOUND ABDOMEN LIMITED RIGHT UPPER QUADRANT COMPARISON:  CT from 10/24/2019 FINDINGS: Gallbladder: Gallbladder is again well distended without wall thickening or pericholecystic fluid. No gallstones are seen. Tumefactive sludge is noted within the gallbladder. Common bile duct: Diameter: 3.4  mm. Liver: Somewhat limited due to overlying bowel gas and poor acoustical window. No acute abnormality is noted. Portal vein is patent on color Doppler imaging with normal direction of blood flow towards the liver. Other: None. IMPRESSION: Well distended gallbladder with tumefactive sludge. No definitive cholelithiasis is seen. Limited views of the liver although no acute abnormality is noted. Electronically Signed   By: [name] M.D.   On: 11/12/2019 20:41    Assessment/Plan Active Problems:   Acute on chronic respiratory failure with hypoxia (HCC)   Pulmonary fibrosis, postinflammatory (HCC)   Acute on chronic heart failure with preserved ejection fraction ([place])    COVID-19 virus infection   Paroxysmal atrial fibrillation ([place])   1. Acute on chronic respiratory failure with hypoxia plan is to continue with full support per his request.  Patient wants full intubation and also wants to be a full code if it comes to that however he does not want prolonged mechanical ventilation beyond 2 weeks. 2. Pulmonary fibrosis advanced disease patient will be continued to be monitored closely 3. Acute on chronic heart failure preserved ejection fraction we will monitor fluid status closely 4. COVID-19 virus infection in resolution phase but patient has severe damage to the lungs 5. Paroxysmal atrial fibrillation rate now rate controlled   I have personally seen and evaluated the patient, evaluated laboratory and imaging results, formulated the assessment and plan and placed orders. The Patient requires high complexity decision making with multiple systems involvement.  Rounds were done with the Respiratory Therapy Director and Staff therapists and discussed with nursing staff also.  Allyne Gee, MD [employer] Pulmonary Critical Care Medicine Sleep Medicine

## 2019-11-14 ENCOUNTER — Other Ambulatory Visit (HOSPITAL_COMMUNITY): Payer: Medicare Other

## 2019-11-14 DIAGNOSIS — J9621 Acute and chronic respiratory failure with hypoxia: Secondary | ICD-10-CM | POA: Diagnosis not present

## 2019-11-14 DIAGNOSIS — I5033 Acute on chronic diastolic (congestive) heart failure: Secondary | ICD-10-CM | POA: Diagnosis not present

## 2019-11-14 DIAGNOSIS — U071 COVID-19: Secondary | ICD-10-CM | POA: Diagnosis not present

## 2019-11-14 DIAGNOSIS — I480 Paroxysmal atrial fibrillation: Secondary | ICD-10-CM | POA: Diagnosis not present

## 2019-11-14 LAB — CBC
HCT: 37.9 % — ABNORMAL LOW (ref 39.0–52.0)
Hemoglobin: 12.1 g/dL — ABNORMAL LOW (ref 13.0–17.0)
MCH: 30.3 pg (ref 26.0–34.0)
MCHC: 31.9 g/dL (ref 30.0–36.0)
MCV: 95 fL (ref 80.0–100.0)
Platelets: 192 10*3/uL (ref 150–400)
RBC: 3.99 MIL/uL — ABNORMAL LOW (ref 4.22–5.81)
RDW: 16.4 % — ABNORMAL HIGH (ref 11.5–15.5)
WBC: 10.4 10*3/uL (ref 4.0–10.5)
nRBC: 0.3 % — ABNORMAL HIGH (ref 0.0–0.2)

## 2019-11-14 LAB — BASIC METABOLIC PANEL
Anion gap: 9 (ref 5–15)
BUN: 31 mg/dL — ABNORMAL HIGH (ref 8–23)
CO2: 35 mmol/L — ABNORMAL HIGH (ref 22–32)
Calcium: 7.9 mg/dL — ABNORMAL LOW (ref 8.9–10.3)
Chloride: 96 mmol/L — ABNORMAL LOW (ref 98–111)
Creatinine, Ser: 0.79 mg/dL (ref 0.61–1.24)
GFR calc Af Amer: >60 mL/min (ref >60)
GFR calc non Af Amer: >60 mL/min (ref >60)
Glucose, Bld: 201 mg/dL — ABNORMAL HIGH (ref 70–99)
Potassium: 3.5 mmol/L (ref 3.5–5.1)
Sodium: 140 mmol/L (ref 135–145)

## 2019-11-14 LAB — VANCOMYCIN, TROUGH: Vancomycin Tr: 13 ug/mL — ABNORMAL LOW (ref 15–20)

## 2019-11-14 NOTE — Progress Notes (Signed)
Pulmonary Critical Care Medicine [facility] [place]   PULMONARY CRITICAL CARE SERVICE  PROGRESS NOTE  Date of Service: 11/14/2019  Karl Mason  JGG:[id number]  DOB: August 25, 1932   DOA: 10/15/2019  Referring Physician: Carron Curie, MD  HPI: Karl Mason is a 86 y.o. male seen for follow up of Acute on Chronic Respiratory Failure.  Patient at this time is on 40 L of oxygen saturations are running about 90%.  Patient is currently on 100% FiO2.  Yesterday as already discussed with urinary prolonged meeting with the patient as well as the daughters and they would like for him as well as he would like to be continued as a full code and he would desire intubation as well as CPR even though this may prove to be futile  Medications: Reviewed on Rounds  Physical Exam:  Vitals: Temperature is 96.2 pulse 100 respiratory rate 13 blood pressure 149/51 saturations 90%  Ventilator Settings off the ventilator right now on 40 L high flow with an FiO2 of 100%  . General: Comfortable at this time . Eyes: Grossly normal lids, irises & conjunctiva . ENT: grossly tongue is normal . Neck: no obvious mass . Cardiovascular: S1 S2 normal no gallop . Respiratory: Scattered coarse breath sounds are noted bilaterally . Abdomen: soft . Skin: no rash seen on limited exam . Musculoskeletal: not rigid . Psychiatric:unable to assess . Neurologic: no seizure no involuntary movements         Lab Data:   Basic Metabolic Panel: Recent Labs  Lab 11/08/19 0453 11/09/19 0629 11/11/19 0807 11/12/19 0659 11/14/19 0409  NA 142  --  150* 143 140  K 3.2* 3.0* 3.9 4.2 3.5  CL 97*  --  102 97* 96*  CO2 34*  --  39* 35* 35*  GLUCOSE 237*  --  225* 255* 201*  BUN 26*  --  25* 25* 31*  CREATININE 1.04  --  0.89 0.84 0.79  CALCIUM 8.1*  --  8.6* 8.0* 7.9*    ABG: Recent Labs  Lab 11/11/19 1050  PHART 7.488*  PCO2ART 45.4  PO2ART 75.5*  HCO3 34.6*  O2SAT 96.4    Liver Function Tests: No  results for input(s): AST, ALT, ALKPHOS, BILITOT, PROT, ALBUMIN in the last 168 hours. No results for input(s): LIPASE, AMYLASE in the last 168 hours. No results for input(s): AMMONIA in the last 168 hours.  CBC: Recent Labs  Lab 11/11/19 0807 11/13/19 0635 11/14/19 0409  WBC 11.7* 10.0 10.4  HGB 13.5 13.0 12.1*  HCT 43.0 41.7 37.9*  MCV 96.4 96.1 95.0  PLT 250 221 192    Cardiac Enzymes: No results for input(s): CKTOTAL, CKMB, CKMBINDEX, TROPONINI in the last 168 hours.  BNP (last 3 results) Recent Labs    11/02/19 1224 11/06/19 1254  BNP 79.7 175.0*    ProBNP (last 3 results) No results for input(s): PROBNP in the last 8760 hours.  Radiological Exams: DG CHEST PORT 1 VIEW  Result Date: 11/14/2019 CLINICAL DATA:  Hypoxemia. Shortness of breath. Pulmonary fibrosis. The patient had COVID-19 in October 2020. EXAM: PORTABLE CHEST 1 VIEW COMPARISON:  Chest x-rays dated 11/11/2019, 11/02/2019 and 09/12/2019 and CT scan dated 11/07/2019 FINDINGS: Heart size is within normal limits considering the AP portable technique. Diffuse accentuation of the interstitial markings is essentially unchanged. No effusions. Pulmonary vascularity is within normal limits considering the shallow inspiration. No acute bone abnormality. IMPRESSION: No significant change in the diffuse accentuation of the interstitial markings consistent  with the patient's history of pulmonary fibrosis. Electronically Signed   By: Lorriane Shire M.D.   On: 11/14/2019 15:29   US Abdomen Limited RUQ  Result Date: 11/12/2019 CLINICAL DATA:  Abdominal pain for 1 night EXAM: ULTRASOUND ABDOMEN LIMITED RIGHT UPPER QUADRANT COMPARISON:  CT from 10/24/2019 FINDINGS: Gallbladder: Gallbladder is again well distended without wall thickening or pericholecystic fluid. No gallstones are seen. Tumefactive sludge is noted within the gallbladder. Common bile duct: Diameter: 3.4 mm. Liver: Somewhat limited due to overlying bowel gas and poor  acoustical window. No acute abnormality is noted. Portal vein is patent on color Doppler imaging with normal direction of blood flow towards the liver. Other: None. IMPRESSION: Well distended gallbladder with tumefactive sludge. No definitive cholelithiasis is seen. Limited views of the liver although no acute abnormality is noted. Electronically Signed   By: [name] M.D.   On: 11/12/2019 20:41    Assessment/Plan Active Problems:   Acute on chronic respiratory failure with hypoxia (HCC)   Pulmonary fibrosis, postinflammatory (HCC)   Acute on chronic heart failure with preserved ejection fraction ([place])   COVID-19 virus infection   Paroxysmal atrial fibrillation ([place])   1. Acute on chronic respiratory failure with hypoxia patient is going to be continued on high flow oxygen as discussed yesterday if patient has cardiac arrest he wants to have CPR done if patient needs to be intubated he wants to be intubated. 2. Pulmonary fibrosis advanced severe disease 3. Acute on chronic congestive heart failure with preserved ejection fraction plan is to monitor fluid status closely 4. COVID-19 virus infection in resolution phase 5. Paroxysmal atrial fibrillation rate is controlled we will continue to monitor   I have personally seen and evaluated the patient, evaluated laboratory and imaging results, formulated the assessment and plan and placed orders. The Patient requires high complexity decision making with multiple systems involvement.  Rounds were done with the Respiratory Therapy Director and Staff therapists and discussed with nursing staff also.  Allyne Gee, MD [facility] Pulmonary Critical Care Medicine Sleep Medicine

## 2019-11-15 DIAGNOSIS — I5033 Acute on chronic diastolic (congestive) heart failure: Secondary | ICD-10-CM | POA: Diagnosis not present

## 2019-11-15 DIAGNOSIS — U071 COVID-19: Secondary | ICD-10-CM | POA: Diagnosis not present

## 2019-11-15 DIAGNOSIS — I480 Paroxysmal atrial fibrillation: Secondary | ICD-10-CM | POA: Diagnosis not present

## 2019-11-15 DIAGNOSIS — J9621 Acute and chronic respiratory failure with hypoxia: Secondary | ICD-10-CM | POA: Diagnosis not present

## 2019-11-15 NOTE — Progress Notes (Signed)
Pulmonary Critical Care Medicine [facility] [place]   PULMONARY CRITICAL CARE SERVICE  PROGRESS NOTE  Date of Service: 11/15/2019  Karl Mason  XIP:[id number]  DOB: 03-Nov-1932   DOA: 2019/10/27  Referring Physician: Carron Curie, MD  HPI: Karl Mason is a 86 y.o. male seen for follow up of Acute on Chronic Respiratory Failure.  Patient continues to do poorly right now is on 4 L of oxygen and requiring 100% FiO2.  He is awake appears to be comfortable otherwise  Medications: Reviewed on Rounds  Physical Exam:  Vitals: Temperature is 97.4 pulse 88 respiratory rate 29 blood pressure is 126/70 saturations 92%  Ventilator Settings on 40 L FiO2 of 100%  . General: Comfortable at this time . Eyes: Grossly normal lids, irises & conjunctiva . ENT: grossly tongue is normal . Neck: no obvious mass . Cardiovascular: S1 S2 normal no gallop . Respiratory: Coarse breath sounds few scattered rhonchi . Abdomen: soft . Skin: no rash seen on limited exam . Musculoskeletal: not rigid . Psychiatric:unable to assess . Neurologic: no seizure no involuntary movements         Lab Data:   Basic Metabolic Panel: Recent Labs  Lab 11/09/19 0629 11/11/19 0807 11/12/19 0659 11/14/19 0409  NA  --  150* 143 140  K 3.0* 3.9 4.2 3.5  CL  --  102 97* 96*  CO2  --  39* 35* 35*  GLUCOSE  --  225* 255* 201*  BUN  --  25* 25* 31*  CREATININE  --  0.89 0.84 0.79  CALCIUM  --  8.6* 8.0* 7.9*    ABG: Recent Labs  Lab 11/11/19 1050  PHART 7.488*  PCO2ART 45.4  PO2ART 75.5*  HCO3 34.6*  O2SAT 96.4    Liver Function Tests: No results for input(s): AST, ALT, ALKPHOS, BILITOT, PROT, ALBUMIN in the last 168 hours. No results for input(s): LIPASE, AMYLASE in the last 168 hours. No results for input(s): AMMONIA in the last 168 hours.  CBC: Recent Labs  Lab 11/11/19 0807 11/13/19 0635 11/14/19 0409  WBC 11.7* 10.0 10.4  HGB 13.5 13.0 12.1*  HCT 43.0 41.7 37.9*  MCV 96.4 96.1  95.0  PLT 250 221 192    Cardiac Enzymes: No results for input(s): CKTOTAL, CKMB, CKMBINDEX, TROPONINI in the last 168 hours.  BNP (last 3 results) Recent Labs    11/02/19 1224 11/06/19 1254  BNP 79.7 175.0*    ProBNP (last 3 results) No results for input(s): PROBNP in the last 8760 hours.  Radiological Exams: DG CHEST PORT 1 VIEW  Result Date: 11/14/2019 CLINICAL DATA:  Hypoxemia. Shortness of breath. Pulmonary fibrosis. The patient had COVID-19 in October 2020. EXAM: PORTABLE CHEST 1 VIEW COMPARISON:  Chest x-rays dated 11/11/2019, 11/02/2019 and 09/12/2019 and CT scan dated 11/07/2019 FINDINGS: Heart size is within normal limits considering the AP portable technique. Diffuse accentuation of the interstitial markings is essentially unchanged. No effusions. Pulmonary vascularity is within normal limits considering the shallow inspiration. No acute bone abnormality. IMPRESSION: No significant change in the diffuse accentuation of the interstitial markings consistent with the patient's history of pulmonary fibrosis. Electronically Signed   By: Francene Boyers M.D.   On: 11/14/2019 15:29    Assessment/Plan Active Problems:   Acute on chronic respiratory failure with hypoxia (HCC)   Pulmonary fibrosis, postinflammatory (HCC)   Acute on chronic heart failure with preserved ejection fraction (HCC)   COVID-19 virus infection   Paroxysmal atrial fibrillation (HCC)  1. Acute on chronic respiratory failure with hypoxia patient at this time is on 100% FiO2 on high flow nasal cannula.  As already noted previously patient wants everything done so if there is a [place] wants to be intubated and also have CPR. 2. Pulmonary fibrosis poor prognosis we will continue with supportive care 3. Acute on chronic heart failure preserved ejection fraction we will continue supportive care 4. COVID-19 virus infection in resolution phase 5. Paroxysmal atrial fibrillation rate controlled   I have  personally seen and evaluated the patient, evaluated laboratory and imaging results, formulated the assessment and plan and placed orders. The Patient requires high complexity decision making with multiple systems involvement.  Rounds were done with the Respiratory Therapy Director and Staff therapists and discussed with nursing staff also.  Allyne Gee, MD [facility] Pulmonary Critical Care Medicine Sleep Medicine

## 2019-11-15 DEATH — deceased

## 2019-11-16 LAB — BLOOD GAS, ARTERIAL
Acid-Base Excess: 15.6 mmol/L — ABNORMAL HIGH (ref 0.0–2.0)
Acid-Base Excess: 13.9 mmol/L — ABNORMAL HIGH (ref 0.0–2.0)
Acid-Base Excess: 13.3 mmol/L — ABNORMAL HIGH (ref 0.0–2.0)
Bicarbonate: 40.3 mmol/L — ABNORMAL HIGH (ref 20.0–28.0)
Bicarbonate: 41.1 mmol/L — ABNORMAL HIGH (ref 20.0–28.0)
Bicarbonate: 40.1 mmol/L — ABNORMAL HIGH (ref 20.0–28.0)
Drawn by: 243969
Drawn by: 243969
FIO2: 100
FIO2: 90
FIO2: 75
O2 Saturation: 98.4 %
O2 Saturation: 97 %
O2 Saturation: 97.4 %
Patient temperature: 36.8
Patient temperature: 36.2
Patient temperature: 36.1
pCO2 arterial: 53.1 mmHg — ABNORMAL HIGH (ref 32.0–48.0)
pCO2 arterial: 89.4 mmHg (ref 32.0–48.0)
pCO2 arterial: 81.2 mmHg (ref 32.0–48.0)
pH, Arterial: 7.491 — ABNORMAL HIGH (ref 7.350–7.450)
pH, Arterial: 7.28 — ABNORMAL LOW (ref 7.350–7.450)
pH, Arterial: 7.309 — ABNORMAL LOW (ref 7.350–7.450)
pO2, Arterial: 114 mmHg — ABNORMAL HIGH (ref 83.0–108.0)
pO2, Arterial: 101 mmHg (ref 83.0–108.0)
pO2, Arterial: 97.4 mmHg (ref 83.0–108.0)

## 2019-11-16 LAB — BASIC METABOLIC PANEL
Anion gap: 13 (ref 5–15)
BUN: 28 mg/dL — ABNORMAL HIGH (ref 8–23)
CO2: 40 mmol/L — ABNORMAL HIGH (ref 22–32)
Calcium: 8.2 mg/dL — ABNORMAL LOW (ref 8.9–10.3)
Chloride: 91 mmol/L — ABNORMAL LOW (ref 98–111)
Creatinine, Ser: 0.77 mg/dL (ref 0.61–1.24)
GFR calc Af Amer: >60 mL/min (ref >60)
GFR calc non Af Amer: >60 mL/min (ref >60)
Glucose, Bld: 230 mg/dL — ABNORMAL HIGH (ref 70–99)
Potassium: 3.5 mmol/L (ref 3.5–5.1)
Sodium: 144 mmol/L (ref 135–145)

## 2019-11-16 LAB — CBC
HCT: 40.7 % (ref 39.0–52.0)
Hemoglobin: 12.9 g/dL — ABNORMAL LOW (ref 13.0–17.0)
MCH: 30.4 pg (ref 26.0–34.0)
MCHC: 31.7 g/dL (ref 30.0–36.0)
MCV: 95.8 fL (ref 80.0–100.0)
Platelets: 219 10*3/uL (ref 150–400)
RBC: 4.25 MIL/uL (ref 4.22–5.81)
RDW: 17.1 % — ABNORMAL HIGH (ref 11.5–15.5)
WBC: 18.2 10*3/uL — ABNORMAL HIGH (ref 4.0–10.5)
nRBC: 0 % (ref 0.0–0.2)

## 2019-11-17 ENCOUNTER — Other Ambulatory Visit (HOSPITAL_COMMUNITY): Payer: Medicare Other

## 2019-11-17 DIAGNOSIS — U071 COVID-19: Secondary | ICD-10-CM | POA: Diagnosis not present

## 2019-11-17 DIAGNOSIS — J9621 Acute and chronic respiratory failure with hypoxia: Secondary | ICD-10-CM | POA: Diagnosis not present

## 2019-11-17 DIAGNOSIS — I5033 Acute on chronic diastolic (congestive) heart failure: Secondary | ICD-10-CM | POA: Diagnosis not present

## 2019-11-17 DIAGNOSIS — I480 Paroxysmal atrial fibrillation: Secondary | ICD-10-CM | POA: Diagnosis not present

## 2019-11-17 LAB — CBC
HCT: 41.7 % (ref 39.0–52.0)
Hemoglobin: 12.5 g/dL — ABNORMAL LOW (ref 13.0–17.0)
MCH: 29.8 pg (ref 26.0–34.0)
MCHC: 30 g/dL (ref 30.0–36.0)
MCV: 99.5 fL (ref 80.0–100.0)
Platelets: 197 10*3/uL (ref 150–400)
RBC: 4.19 MIL/uL — ABNORMAL LOW (ref 4.22–5.81)
RDW: 16.7 % — ABNORMAL HIGH (ref 11.5–15.5)
WBC: 23.3 10*3/uL — ABNORMAL HIGH (ref 4.0–10.5)
nRBC: 0.1 % (ref 0.0–0.2)

## 2019-11-17 LAB — BLOOD GAS, ARTERIAL
Acid-Base Excess: 12.7 mmol/L — ABNORMAL HIGH (ref 0.0–2.0)
Acid-Base Excess: 14 mmol/L — ABNORMAL HIGH (ref 0.0–2.0)
Bicarbonate: 39.9 mmol/L — ABNORMAL HIGH (ref 20.0–28.0)
Bicarbonate: 40.2 mmol/L — ABNORMAL HIGH (ref 20.0–28.0)
Drawn by: 243969
FIO2: 70
FIO2: 70
O2 Saturation: 94 %
O2 Saturation: 92.9 %
Patient temperature: 37
Patient temperature: 36.1
pCO2 arterial: 91.5 mmHg (ref 32.0–48.0)
pCO2 arterial: 72.6 mmHg (ref 32.0–48.0)
pH, Arterial: 7.263 — ABNORMAL LOW (ref 7.350–7.450)
pH, Arterial: 7.357 (ref 7.350–7.450)
pO2, Arterial: 80 mmHg — ABNORMAL LOW (ref 83.0–108.0)
pO2, Arterial: 64.8 mmHg — ABNORMAL LOW (ref 83.0–108.0)

## 2019-11-17 LAB — BASIC METABOLIC PANEL
Anion gap: 12 (ref 5–15)
BUN: 39 mg/dL — ABNORMAL HIGH (ref 8–23)
CO2: 39 mmol/L — ABNORMAL HIGH (ref 22–32)
Calcium: 8.3 mg/dL — ABNORMAL LOW (ref 8.9–10.3)
Chloride: 94 mmol/L — ABNORMAL LOW (ref 98–111)
Creatinine, Ser: 1.1 mg/dL (ref 0.61–1.24)
GFR calc Af Amer: >60 mL/min (ref >60)
GFR calc non Af Amer: >60 mL/min (ref >60)
Glucose, Bld: 241 mg/dL — ABNORMAL HIGH (ref 70–99)
Potassium: 4.2 mmol/L (ref 3.5–5.1)
Sodium: 145 mmol/L (ref 135–145)

## 2019-11-17 LAB — VANCOMYCIN, TROUGH: Vancomycin Tr: 16 ug/mL (ref 15–20)

## 2019-11-17 NOTE — Progress Notes (Signed)
Pulmonary Critical Care Medicine [facility] [place]   PULMONARY CRITICAL CARE SERVICE  PROGRESS NOTE  Date of Service: 11/17/2019  JC VERON  YPP:[id number]  DOB: 1933/03/02   DOA: 11/07/2019  Referring Physician: Carron Curie, MD  HPI: Karl Mason is a 86 y.o. male seen for follow up of Acute on Chronic Respiratory Failure.  Patient had worsening of PCO2 patient is right now on basically continuous BiPAP.  We will going to adjust the pressures a little bit more from 16/8 we will increase the pressure so as to improve chances avoiding intubation.  Medications: Reviewed on Rounds  Physical Exam:  Vitals: Temperature 97.4 pulse 104 respiratory rate 38 blood pressure is 107/70 saturations 96%  Ventilator Settings on BiPAP 16/8  . General: Comfortable at this time . Eyes: Grossly normal lids, irises & conjunctiva . ENT: grossly tongue is normal . Neck: no obvious mass . Cardiovascular: S1 S2 normal no gallop . Respiratory: Coarse rhonchi bilaterally . Abdomen: soft . Skin: no rash seen on limited exam . Musculoskeletal: not rigid . Psychiatric:unable to assess . Neurologic: no seizure no involuntary movements         Lab Data:   Basic Metabolic Panel: Recent Labs  Lab 11/11/19 0807 11/12/19 0659 11/14/19 0409 11/16/19 0606 11/17/19 0556  NA 150* 143 140 144 145  K 3.9 4.2 3.5 3.5 4.2  CL 102 97* 96* 91* 94*  CO2 39* 35* 35* 40* 39*  GLUCOSE 225* 255* 201* 230* 241*  BUN 25* 25* 31* 28* 39*  CREATININE 0.89 0.84 0.79 0.77 1.10  CALCIUM 8.6* 8.0* 7.9* 8.2* 8.3*    ABG: Recent Labs  Lab 11/11/19 1050 11/16/19 0955 11/16/19 2020 11/16/19 2255  PHART 7.488* 7.491* 7.280* 7.309*  PCO2ART 45.4 53.1* 89.4* 81.2*  PO2ART 75.5* 114* 101 97.4  HCO3 34.6* 40.3* 41.1* 40.1*  O2SAT 96.4 98.4 97.0 97.4    Liver Function Tests: No results for input(s): AST, ALT, ALKPHOS, BILITOT, PROT, ALBUMIN in the last 168 hours. No results for input(s): LIPASE,  AMYLASE in the last 168 hours. No results for input(s): AMMONIA in the last 168 hours.  CBC: Recent Labs  Lab 11/11/19 0807 11/13/19 0635 11/14/19 0409 11/16/19 0606 11/17/19 0556  WBC 11.7* 10.0 10.4 18.2* 23.3*  HGB 13.5 13.0 12.1* 12.9* 12.5*  HCT 43.0 41.7 37.9* 40.7 41.7  MCV 96.4 96.1 95.0 95.8 99.5  PLT 250 221 192 219 197    Cardiac Enzymes: No results for input(s): CKTOTAL, CKMB, CKMBINDEX, TROPONINI in the last 168 hours.  BNP (last 3 results) Recent Labs    11/02/19 1224 11/06/19 1254  BNP 79.7 175.0*    ProBNP (last 3 results) No results for input(s): PROBNP in the last 8760 hours.  Radiological Exams: No results found.  Assessment/Plan Active Problems:   Acute on chronic respiratory failure with hypoxia (HCC)   Pulmonary fibrosis, postinflammatory (HCC)   Acute on chronic heart failure with preserved ejection fraction (HCC)   COVID-19 virus infection   Paroxysmal atrial fibrillation (HCC)   1. Acute on chronic respiratory failure with hypoxia patient remains critically ill on the BiPAP 16/8.  We are going to try to adjust the pressures on the BiPAP if possible to avoid intubation however it is looking like He is headed towards intubation and mechanical ventilation. 2. Pulmonary fibrosis advanced severe disease worsened by the COVID-19 infection 3. COVID-19 virus infection this in itself has been treated 4. Acute heart failure preserved ejection fraction monitor fluid  status 5. Paroxysmal atrial fibrillation rate now rate controlled we will continue with supportive care   I have personally seen and evaluated the patient, evaluated laboratory and imaging results, formulated the assessment and plan and placed orders. The Patient requires high complexity decision making with multiple systems involvement.  Rounds were done with the Respiratory Therapy Director and Staff therapists and discussed with nursing staff also.  Time 35 minutes patient is  critically ill in danger of cardiac arrest and death  Allyne Gee, MD [facility] Pulmonary Critical Care Medicine Sleep Medicine

## 2019-11-18 ENCOUNTER — Other Ambulatory Visit (HOSPITAL_COMMUNITY): Payer: Medicare Other

## 2019-11-18 DIAGNOSIS — I480 Paroxysmal atrial fibrillation: Secondary | ICD-10-CM | POA: Diagnosis not present

## 2019-11-18 DIAGNOSIS — U071 COVID-19: Secondary | ICD-10-CM | POA: Diagnosis not present

## 2019-11-18 DIAGNOSIS — J9621 Acute and chronic respiratory failure with hypoxia: Secondary | ICD-10-CM | POA: Diagnosis not present

## 2019-11-18 DIAGNOSIS — I5033 Acute on chronic diastolic (congestive) heart failure: Secondary | ICD-10-CM | POA: Diagnosis not present

## 2019-11-18 LAB — CBC
HCT: 42 % (ref 39.0–52.0)
Hemoglobin: 12.9 g/dL — ABNORMAL LOW (ref 13.0–17.0)
MCH: 30.1 pg (ref 26.0–34.0)
MCHC: 30.7 g/dL (ref 30.0–36.0)
MCV: 97.9 fL (ref 80.0–100.0)
Platelets: 221 10*3/uL (ref 150–400)
RBC: 4.29 MIL/uL (ref 4.22–5.81)
RDW: 16.7 % — ABNORMAL HIGH (ref 11.5–15.5)
WBC: 24.1 10*3/uL — ABNORMAL HIGH (ref 4.0–10.5)
nRBC: 0.1 % (ref 0.0–0.2)

## 2019-11-18 LAB — BASIC METABOLIC PANEL
Anion gap: 11 (ref 5–15)
BUN: 36 mg/dL — ABNORMAL HIGH (ref 8–23)
CO2: 38 mmol/L — ABNORMAL HIGH (ref 22–32)
Calcium: 8.5 mg/dL — ABNORMAL LOW (ref 8.9–10.3)
Chloride: 100 mmol/L (ref 98–111)
Creatinine, Ser: 0.76 mg/dL (ref 0.61–1.24)
GFR calc Af Amer: >60 mL/min (ref >60)
GFR calc non Af Amer: >60 mL/min (ref >60)
Glucose, Bld: 166 mg/dL — ABNORMAL HIGH (ref 70–99)
Potassium: 3.6 mmol/L (ref 3.5–5.1)
Sodium: 149 mmol/L — ABNORMAL HIGH (ref 135–145)

## 2019-11-18 NOTE — Progress Notes (Signed)
Pulmonary Critical Care Medicine [facility] [place]   PULMONARY CRITICAL CARE SERVICE  PROGRESS NOTE  Date of Service: 11/18/2019  EARNESTINE Mason  OJJ:[id number]  DOB: August 26, 1932   DOA: 10/31/2019  Referring Physician: Carron Curie, MD  HPI: Karl Mason is a 86 y.o. male seen for follow up of Acute on Chronic Respiratory Failure.  Patient had continued decline in status yesterday evening after changing the BiPAP settings.  Patient had worsening of the pH as well as the PCO2 going up over 90 so therefore decision was made to go ahead and intubate   Medications: Reviewed on Rounds  Physical Exam:  Vitals: Temperature is 98.2 pulse 97 respiratory 24 blood pressure is 103/55 saturations 96%  Ventilator Settings on assist control FiO2 65% tidal volume 522 PEEP 5  . General: Comfortable at this time . Eyes: Grossly normal lids, irises & conjunctiva . ENT: grossly tongue is normal . Neck: no obvious mass . Cardiovascular: S1 S2 normal no gallop . Respiratory: Coarse breath sounds few rhonchi . Abdomen: soft . Skin: no rash seen on limited exam . Musculoskeletal: not rigid . Psychiatric:unable to assess . Neurologic: no seizure no involuntary movements         Lab Data:   Basic Metabolic Panel: Recent Labs  Lab 11/12/19 0659 11/14/19 0409 11/16/19 0606 11/17/19 0556 11/18/19 0534  NA 143 140 144 145 149*  K 4.2 3.5 3.5 4.2 3.6  CL 97* 96* 91* 94* 100  CO2 35* 35* 40* 39* 38*  GLUCOSE 255* 201* 230* 241* 166*  BUN 25* 31* 28* 39* 36*  CREATININE 0.84 0.79 0.77 1.10 0.76  CALCIUM 8.0* 7.9* 8.2* 8.3* 8.5*    ABG: Recent Labs  Lab 11/16/19 0955 11/16/19 2020 11/16/19 2255 11/17/19 1815 11/17/19 1942  PHART 7.491* 7.280* 7.309* 7.263* 7.357  PCO2ART 53.1* 89.4* 81.2* 91.5* 72.6*  PO2ART 114* 101 97.4 80.0* 64.8*  HCO3 40.3* 41.1* 40.1* 39.9* 40.2*  O2SAT 98.4 97.0 97.4 94.0 92.9    Liver Function Tests: No results for input(s): AST, ALT, ALKPHOS,  BILITOT, PROT, ALBUMIN in the last 168 hours. No results for input(s): LIPASE, AMYLASE in the last 168 hours. No results for input(s): AMMONIA in the last 168 hours.  CBC: Recent Labs  Lab 11/13/19 0635 11/14/19 0409 11/16/19 0606 11/17/19 0556 11/18/19 0534  WBC 10.0 10.4 18.2* 23.3* 24.1*  HGB 13.0 12.1* 12.9* 12.5* 12.9*  HCT 41.7 37.9* 40.7 41.7 42.0  MCV 96.1 95.0 95.8 99.5 97.9  PLT 221 192 219 197 221    Cardiac Enzymes: No results for input(s): CKTOTAL, CKMB, CKMBINDEX, TROPONINI in the last 168 hours.  BNP (last 3 results) Recent Labs    11/02/19 1224 11/06/19 1254  BNP 79.7 175.0*    ProBNP (last 3 results) No results for input(s): PROBNP in the last 8760 hours.  Radiological Exams: DG CHEST PORT 1 VIEW  Result Date: 11/17/2019 CLINICAL DATA:  Status post intubation EXAM: PORTABLE CHEST 1 VIEW COMPARISON:  11/14/2019 FINDINGS: Cardiac shadow is mildly enlarged but stable. Endotracheal tube is noted in satisfactory position approximately 4.5 cm above the carina. The overall inspiratory effort is poor. Diffuse patchy opacities are noted throughout both lungs consistent with the known pulmonary fibrosis. Mild superimposed edema could not be totally excluded. No focal confluent infiltrate is seen. No bony abnormality is noted. IMPRESSION: Endotracheal tube in satisfactory position. Slight increase in the interstitial changes are noted in both lungs likely representing some mild edema superimposed over diffuse  chronic fibrotic change. Electronically Signed   By: [name] M.D.   On: 11/17/2019 20:18   DG Abd Portable 1V  Result Date: 11/18/2019 CLINICAL DATA:  Nasogastric tube placement EXAM: PORTABLE ABDOMEN - 1 VIEW COMPARISON:  11/18/2019 at 3:02 a.m. FINDINGS: The tip and side port of the nasogastric tube now projects within the stomach. IMPRESSION: Nasogastric tube tip and side port within the stomach. Electronically Signed   By: Ulyses Jarred M.D.   On: 11/18/2019  03:34   DG Abd Portable 1V  Result Date: 11/18/2019 CLINICAL DATA:  Nasogastric tube placement EXAM: PORTABLE ABDOMEN - 1 VIEW COMPARISON:  11/18/2019 at 1:32 a.m. FINDINGS: Tip the nasogastric tube is in the midthoracic esophagus. A subsequent radiograph spine obtained at the time of dictation. There are bilateral pulmonary interstitial opacities. Endotracheal tube tip is just below the level of the clavicular heads. IMPRESSION: NG tube tip in the midthoracic esophagus. Electronically Signed   By: Ulyses Jarred M.D.   On: 11/18/2019 03:33   DG Abd Portable 1V  Result Date: 11/18/2019 CLINICAL DATA:  Check gastric catheter placement EXAM: PORTABLE ABDOMEN - 1 VIEW COMPARISON:  Film from earlier in the same day. FINDINGS: The gastric catheter is again noted in the distal esophagus. If it has been advanced it is likely coiled within the chest or cervical esophagus. Chest x-ray may be helpful for further evaluation. IMPRESSION: No significant interval advancement of the gastric catheter. It is likely looped within the cervical or thoracic esophagus. Electronically Signed   By: [name] M.D.   On: 11/18/2019 01:44   DG Abd Portable 1V  Result Date: 11/18/2019 CLINICAL DATA:  Check gastric catheter placement EXAM: PORTABLE ABDOMEN - 1 VIEW COMPARISON:  10/27/2019, chest x-ray from the previous day. FINDINGS: Gastric catheter is now seen with the tip in the distal esophagus just above the gastroesophageal junction. This should be advanced several cm. No obstructive changes in the abdomen are seen. Scattered parenchymal opacities are again noted similar to that seen on recent chest x-ray. IMPRESSION: Gastric catheter within the distal esophagus. This should be advanced several cm. Electronically Signed   By: [name] M.D.   On: 11/18/2019 00:27    Assessment/Plan Active Problems:   Acute on chronic respiratory failure with hypoxia (HCC)   Pulmonary fibrosis, postinflammatory (HCC)   Acute on chronic  heart failure with preserved ejection fraction ([place])   COVID-19 virus infection   Paroxysmal atrial fibrillation ([place])   1. Acute on chronic respiratory failure with hypoxia patient had been having a steady decline in his status.  Overnight patient had worsening of pH and PCO2 and patient was intubated placed on the ventilator after having failed BiPAP therapy.  Now is orally intubated and is sedated.  As per our discussion with the patient and the family he did not want to keep living on the ventilator so we are going to see how he does in the coming days to weeks and then if he shows no improvement will need to have a conversation with the family regarding his wishes. 2. Pulmonary fibrosis advanced severe disease we will continue with supportive care at this time. 3. Acute on chronic congestive heart failure monitor fluid status diuretics as tolerated. 4. COVID-19 virus infection treated at the outside facility with severe residual damage 5. Paroxysmal atrial fibrillation right now rate controlled we will continue with supportive care   I have personally seen and evaluated the patient, evaluated laboratory and imaging results, formulated the  assessment and plan and placed orders. The Patient requires high complexity decision making with multiple systems involvement.  Rounds were done with the Respiratory Therapy Director and Staff therapists and discussed with nursing staff also.  Time 35 minutes patient is critically ill in danger of cardiac arrest and death  Yevonne Pax, MD [facility] Pulmonary Critical Care Medicine Sleep Medicine

## 2019-11-19 ENCOUNTER — Other Ambulatory Visit (HOSPITAL_COMMUNITY): Payer: Medicare Other

## 2019-11-19 DIAGNOSIS — I5033 Acute on chronic diastolic (congestive) heart failure: Secondary | ICD-10-CM | POA: Diagnosis not present

## 2019-11-19 DIAGNOSIS — U071 COVID-19: Secondary | ICD-10-CM | POA: Diagnosis not present

## 2019-11-19 DIAGNOSIS — I480 Paroxysmal atrial fibrillation: Secondary | ICD-10-CM | POA: Diagnosis not present

## 2019-11-19 DIAGNOSIS — J9621 Acute and chronic respiratory failure with hypoxia: Secondary | ICD-10-CM | POA: Diagnosis not present

## 2019-11-19 LAB — BASIC METABOLIC PANEL
Anion gap: 12 (ref 5–15)
BUN: 40 mg/dL — ABNORMAL HIGH (ref 8–23)
CO2: 39 mmol/L — ABNORMAL HIGH (ref 22–32)
Calcium: 7.8 mg/dL — ABNORMAL LOW (ref 8.9–10.3)
Chloride: 99 mmol/L (ref 98–111)
Creatinine, Ser: 1.08 mg/dL (ref 0.61–1.24)
GFR calc Af Amer: >60 mL/min (ref >60)
GFR calc non Af Amer: >60 mL/min (ref >60)
Glucose, Bld: 168 mg/dL — ABNORMAL HIGH (ref 70–99)
Potassium: 3.3 mmol/L — ABNORMAL LOW (ref 3.5–5.1)
Sodium: 150 mmol/L — ABNORMAL HIGH (ref 135–145)

## 2019-11-19 LAB — BLOOD GAS, ARTERIAL
Acid-Base Excess: 17.4 mmol/L — ABNORMAL HIGH (ref 0.0–2.0)
Bicarbonate: 42.5 mmol/L — ABNORMAL HIGH (ref 20.0–28.0)
FIO2: 60
O2 Saturation: 91.6 %
Patient temperature: 37
pCO2 arterial: 59.2 mmHg — ABNORMAL HIGH (ref 32.0–48.0)
pH, Arterial: 7.47 — ABNORMAL HIGH (ref 7.350–7.450)
pO2, Arterial: 61.7 mmHg — ABNORMAL LOW (ref 83.0–108.0)

## 2019-11-19 NOTE — Progress Notes (Signed)
Pulmonary Critical Care Medicine [facility] [place]   PULMONARY CRITICAL CARE SERVICE  PROGRESS NOTE  Date of Service: 11/19/2019  [name]  KVQ:[id number]  DOB: [birth year]   DOA: October 21, 2019  Referring Physician: Carron Curie, MD  HPI: Karl Mason is a 86 y.o. male seen for follow up of Acute on Chronic Respiratory Failure.  Patient currently is on assist control mode is on the ventilator on full support.  Family was present at bedside they were updated.  They are in agreement to see how the patient does over the course of the next 10 to 14 days if he does not show any significant improvement then we will be discussing withdrawal of support as per the request of the patient  Medications: Reviewed on Rounds  Physical Exam:  Vitals: Temperature is 97.1 pulse 116 respiratory rate 23 blood pressure is 110/44 saturations 99%  Ventilator Settings on assist control FiO2 60% tidal volume is 500 PEEP 5  . General: Comfortable at this time . Eyes: Grossly normal lids, irises & conjunctiva . ENT: grossly tongue is normal . Neck: no obvious mass . Cardiovascular: S1 S2 normal no gallop . Respiratory: Scattered rhonchi expansion is equal . Abdomen: soft . Skin: no rash seen on limited exam . Musculoskeletal: not rigid . Psychiatric:unable to assess . Neurologic: no seizure no involuntary movements         Lab Data:   Basic Metabolic Panel: Recent Labs  Lab 11/14/19 0409 11/16/19 0606 11/17/19 0556 11/18/19 0534 11/19/19 0552  NA 140 144 145 149* 150*  K 3.5 3.5 4.2 3.6 3.3*  CL 96* 91* 94* 100 99  CO2 35* 40* 39* 38* 39*  GLUCOSE 201* 230* 241* 166* 168*  BUN 31* 28* 39* 36* 40*  CREATININE 0.79 0.77 1.10 0.76 1.08  CALCIUM 7.9* 8.2* 8.3* 8.5* 7.8*    ABG: Recent Labs  Lab 11/16/19 0955 11/16/19 2020 11/16/19 2255 11/17/19 1815 11/17/19 1942  PHART 7.491* 7.280* 7.309* 7.263* 7.357  PCO2ART 53.1* 89.4* 81.2* 91.5* 72.6*  PO2ART 114* 101 97.4 80.0*  64.8*  HCO3 40.3* 41.1* 40.1* 39.9* 40.2*  O2SAT 98.4 97.0 97.4 94.0 92.9    Liver Function Tests: No results for input(s): AST, ALT, ALKPHOS, BILITOT, PROT, ALBUMIN in the last 168 hours. No results for input(s): LIPASE, AMYLASE in the last 168 hours. No results for input(s): AMMONIA in the last 168 hours.  CBC: Recent Labs  Lab 11/13/19 0635 11/14/19 0409 11/16/19 0606 11/17/19 0556 11/18/19 0534  WBC 10.0 10.4 18.2* 23.3* 24.1*  HGB 13.0 12.1* 12.9* 12.5* 12.9*  HCT 41.7 37.9* 40.7 41.7 42.0  MCV 96.1 95.0 95.8 99.5 97.9  PLT 221 192 219 197 221    Cardiac Enzymes: No results for input(s): CKTOTAL, CKMB, CKMBINDEX, TROPONINI in the last 168 hours.  BNP (last 3 results) Recent Labs    11/02/19 1224 11/06/19 1254  BNP 79.7 175.0*    ProBNP (last 3 results) No results for input(s): PROBNP in the last 8760 hours.  Radiological Exams: DG CHEST PORT 1 VIEW  Result Date: 11/17/2019 CLINICAL DATA:  Status post intubation EXAM: PORTABLE CHEST 1 VIEW COMPARISON:  11/14/2019 FINDINGS: Cardiac shadow is mildly enlarged but stable. Endotracheal tube is noted in satisfactory position approximately 4.5 cm above the carina. The overall inspiratory effort is poor. Diffuse patchy opacities are noted throughout both lungs consistent with the known pulmonary fibrosis. Mild superimposed edema could not be totally excluded. No focal confluent infiltrate is seen. No  bony abnormality is noted. IMPRESSION: Endotracheal tube in satisfactory position. Slight increase in the interstitial changes are noted in both lungs likely representing some mild edema superimposed over diffuse chronic fibrotic change. Electronically Signed   By: [name] M.D.   On: 11/17/2019 20:18   DG Abd Portable 1V  Result Date: 11/18/2019 CLINICAL DATA:  Nasogastric tube placement EXAM: PORTABLE ABDOMEN - 1 VIEW COMPARISON:  11/18/2019 at 3:02 a.m. FINDINGS: The tip and side port of the nasogastric tube now projects  within the stomach. IMPRESSION: Nasogastric tube tip and side port within the stomach. Electronically Signed   By: Ulyses Jarred M.D.   On: 11/18/2019 03:34   DG Abd Portable 1V  Result Date: 11/18/2019 CLINICAL DATA:  Nasogastric tube placement EXAM: PORTABLE ABDOMEN - 1 VIEW COMPARISON:  11/18/2019 at 1:32 a.m. FINDINGS: Tip the nasogastric tube is in the midthoracic esophagus. A subsequent radiograph spine obtained at the time of dictation. There are bilateral pulmonary interstitial opacities. Endotracheal tube tip is just below the level of the clavicular heads. IMPRESSION: NG tube tip in the midthoracic esophagus. Electronically Signed   By: Ulyses Jarred M.D.   On: 11/18/2019 03:33   DG Abd Portable 1V  Result Date: 11/18/2019 CLINICAL DATA:  Check gastric catheter placement EXAM: PORTABLE ABDOMEN - 1 VIEW COMPARISON:  Film from earlier in the same day. FINDINGS: The gastric catheter is again noted in the distal esophagus. If it has been advanced it is likely coiled within the chest or cervical esophagus. Chest x-ray may be helpful for further evaluation. IMPRESSION: No significant interval advancement of the gastric catheter. It is likely looped within the cervical or thoracic esophagus. Electronically Signed   By: [name] M.D.   On: 11/18/2019 01:44   DG Abd Portable 1V  Result Date: 11/18/2019 CLINICAL DATA:  Check gastric catheter placement EXAM: PORTABLE ABDOMEN - 1 VIEW COMPARISON:  10/27/2019, chest x-ray from the previous day. FINDINGS: Gastric catheter is now seen with the tip in the distal esophagus just above the gastroesophageal junction. This should be advanced several cm. No obstructive changes in the abdomen are seen. Scattered parenchymal opacities are again noted similar to that seen on recent chest x-ray. IMPRESSION: Gastric catheter within the distal esophagus. This should be advanced several cm. Electronically Signed   By: [name] M.D.   On: 11/18/2019 00:27     Assessment/Plan Active Problems:   Acute on chronic respiratory failure with hypoxia (HCC)   Pulmonary fibrosis, postinflammatory (HCC)   Acute on chronic heart failure with preserved ejection fraction ([place])   COVID-19 virus infection   Paroxysmal atrial fibrillation ([place])   1. Acute on chronic respiratory failure with hypoxia plan is to continue with full support on the ventilator on assist control trying to wean FiO2 down if possible 2. Pulmonary fibrosis advanced severe disease supportive care 3. Acute on chronic heart failure with preserved ejection fraction monitoring fluid status 4. COVID-19 virus infection resolving 5. Paroxysmal atrial fibrillation rate controlled   I have personally seen and evaluated the patient, evaluated laboratory and imaging results, formulated the assessment and plan and placed orders. The Patient requires high complexity decision making with multiple systems involvement.  Rounds were done with the Respiratory Therapy Director and Staff therapists and discussed with nursing staff also.  Allyne Gee, MD [facility] Pulmonary Critical Care Medicine Sleep Medicine

## 2019-11-20 DIAGNOSIS — I5033 Acute on chronic diastolic (congestive) heart failure: Secondary | ICD-10-CM | POA: Diagnosis not present

## 2019-11-20 DIAGNOSIS — U071 COVID-19: Secondary | ICD-10-CM | POA: Diagnosis not present

## 2019-11-20 DIAGNOSIS — J9621 Acute and chronic respiratory failure with hypoxia: Secondary | ICD-10-CM | POA: Diagnosis not present

## 2019-11-20 DIAGNOSIS — I480 Paroxysmal atrial fibrillation: Secondary | ICD-10-CM | POA: Diagnosis not present

## 2019-11-20 NOTE — Progress Notes (Signed)
Pulmonary Critical Care Medicine [facility] [place]   PULMONARY CRITICAL CARE SERVICE  PROGRESS NOTE  Date of Service: 11/20/2019  Karl Mason  STM:[id number]  DOB: April 09, 1933   DOA: 11/02/2019  Referring Physician: Carron Curie, MD  HPI: Karl Mason is a 86 y.o. male seen for follow up of Acute on Chronic Respiratory Failure.  Patient remains on the ventilator and full support currently is on assist control mode still requiring 60% FiO2 PEEP currently is at 5  Medications: Reviewed on Rounds  Physical Exam:  Vitals: Temperature is 96.0 pulse 112 respiratory rate 22 blood pressure 100/46 saturations 94%  Ventilator Settings on assist control FiO2 60% tidal volume 519 PEEP 5  . General: Comfortable at this time . Eyes: Grossly normal lids, irises & conjunctiva . ENT: grossly tongue is normal . Neck: no obvious mass . Cardiovascular: S1 S2 normal no gallop . Respiratory: Scattered rhonchi expansion is equal . Abdomen: soft . Skin: no rash seen on limited exam . Musculoskeletal: not rigid . Psychiatric:unable to assess . Neurologic: no seizure no involuntary movements         Lab Data:   Basic Metabolic Panel: Recent Labs  Lab 11/14/19 0409 11/16/19 0606 11/17/19 0556 11/18/19 0534 11/19/19 0552  NA 140 144 145 149* 150*  K 3.5 3.5 4.2 3.6 3.3*  CL 96* 91* 94* 100 99  CO2 35* 40* 39* 38* 39*  GLUCOSE 201* 230* 241* 166* 168*  BUN 31* 28* 39* 36* 40*  CREATININE 0.79 0.77 1.10 0.76 1.08  CALCIUM 7.9* 8.2* 8.3* 8.5* 7.8*    ABG: Recent Labs  Lab 11/16/19 2020 11/16/19 2255 11/17/19 1815 11/17/19 1942 11/19/19 1655  PHART 7.280* 7.309* 7.263* 7.357 7.470*  PCO2ART 89.4* 81.2* 91.5* 72.6* 59.2*  PO2ART 101 97.4 80.0* 64.8* 61.7*  HCO3 41.1* 40.1* 39.9* 40.2* 42.5*  O2SAT 97.0 97.4 94.0 92.9 91.6    Liver Function Tests: No results for input(s): AST, ALT, ALKPHOS, BILITOT, PROT, ALBUMIN in the last 168 hours. No results for input(s): LIPASE,  AMYLASE in the last 168 hours. No results for input(s): AMMONIA in the last 168 hours.  CBC: Recent Labs  Lab 11/14/19 0409 11/16/19 0606 11/17/19 0556 11/18/19 0534  WBC 10.4 18.2* 23.3* 24.1*  HGB 12.1* 12.9* 12.5* 12.9*  HCT 37.9* 40.7 41.7 42.0  MCV 95.0 95.8 99.5 97.9  PLT 192 219 197 221    Cardiac Enzymes: No results for input(s): CKTOTAL, CKMB, CKMBINDEX, TROPONINI in the last 168 hours.  BNP (last 3 results) Recent Labs    11/02/19 1224 11/06/19 1254  BNP 79.7 175.0*    ProBNP (last 3 results) No results for input(s): PROBNP in the last 8760 hours.  Radiological Exams: DG CHEST PORT 1 VIEW  Result Date: 11/19/2019 CLINICAL DATA:  86 year old male intubated, acute on chronic respiratory failure. EXAM: PORTABLE CHEST 1 VIEW COMPARISON:  Portable chest 11/17/2019 and earlier. FINDINGS: Portable AP semi upright view at 1512 hours. The patient is now rotated to the left. Stable endotracheal tube tip between the level of the clavicles and carina. Enteric tube is in place, side hole at the level of the gastric body. Stable lung volumes and mediastinal contours. No pneumothorax or pleural effusion. Coarse bilateral pulmonary interstitial opacity which by CT on 11/07/2019 more resembled chronic interstitial lung disease. No new pulmonary opacity. Ventilation not significantly changed since 11/14/2019. Negative visible bowel gas pattern. No acute osseous abnormality identified. IMPRESSION: 1. Stable ET tube.  Enteric tube placed into  the stomach. 2. Pulmonary fibrosis/interstitial lung disease with ventilation not significantly changed from 11/14/2019. No new cardiopulmonary abnormality. Electronically Signed   By: Genevie Ann M.D.   On: 11/19/2019 15:30    Assessment/Plan Active Problems:   Acute on chronic respiratory failure with hypoxia (HCC)   Pulmonary fibrosis, postinflammatory (HCC)   Acute on chronic heart failure with preserved ejection fraction ([place])   COVID-19 virus  infection   Paroxysmal atrial fibrillation ([place])   1. Acute on chronic respiratory failure with hypoxia plan is to continue with full support on the ventilator at this time patient's FiO2 is at 60% has not been able to tolerate coming down the FiO2 2. Pulmonary fibrosis advanced severe disease as evident by radiological evaluation this has been already discussed with the family as well as the patient. 3. Acute on chronic congestive heart failure we will continue to monitor fluid status diuresis as tolerated. 4. COVID-19 virus infection resolved 5. Paroxysmal atrial fibrillation currently is rate controlled we are monitoring closely   I have personally seen and evaluated the patient, evaluated laboratory and imaging results, formulated the assessment and plan and placed orders. The Patient requires high complexity decision making with multiple systems involvement.  Rounds were done with the Respiratory Therapy Director and Staff therapists and discussed with nursing staff also.  Patient critically ill in danger of cardiac arrest and death time spent 45 minutes  Allyne Gee, MD [facility] Pulmonary Critical Care Medicine Sleep Medicine

## 2019-11-21 DIAGNOSIS — I5033 Acute on chronic diastolic (congestive) heart failure: Secondary | ICD-10-CM | POA: Diagnosis not present

## 2019-11-21 DIAGNOSIS — J9621 Acute and chronic respiratory failure with hypoxia: Secondary | ICD-10-CM | POA: Diagnosis not present

## 2019-11-21 DIAGNOSIS — I480 Paroxysmal atrial fibrillation: Secondary | ICD-10-CM | POA: Diagnosis not present

## 2019-11-21 DIAGNOSIS — U071 COVID-19: Secondary | ICD-10-CM | POA: Diagnosis not present

## 2019-11-21 LAB — BASIC METABOLIC PANEL
Anion gap: 12 (ref 5–15)
BUN: 60 mg/dL — ABNORMAL HIGH (ref 8–23)
CO2: 35 mmol/L — ABNORMAL HIGH (ref 22–32)
Calcium: 7.9 mg/dL — ABNORMAL LOW (ref 8.9–10.3)
Chloride: 92 mmol/L — ABNORMAL LOW (ref 98–111)
Creatinine, Ser: 0.97 mg/dL (ref 0.61–1.24)
GFR calc Af Amer: >60 mL/min (ref >60)
GFR calc non Af Amer: >60 mL/min (ref >60)
Glucose, Bld: 430 mg/dL — ABNORMAL HIGH (ref 70–99)
Potassium: 4.3 mmol/L (ref 3.5–5.1)
Sodium: 139 mmol/L (ref 135–145)

## 2019-11-21 NOTE — Progress Notes (Addendum)
Pulmonary Critical Care Medicine [facility] [place]   PULMONARY CRITICAL CARE SERVICE  PROGRESS NOTE  Date of Service: 11/21/2019  Karl Mason  HEN:[id number]  DOB: August 15, 1933   DOA: November 16, 2019  Referring Physician: Carron Curie, MD  HPI: Karl Mason is a 86 y.o. male seen for follow up of Acute on Chronic Respiratory Failure.  Patient mains on full support on the ventilator currently requiring 80% FiO2.  Medications: Reviewed on Rounds  Physical Exam:  Vitals: Pulse 90 respirations 24 BP 110/56 O2 sat 91% temp 97.3  Ventilator Settings ventilator mode AC VC rate of 22 tidal volume 500 PEEP of 5 and FiO2 of 80%  . General: Comfortable at this time . Eyes: Grossly normal lids, irises & conjunctiva . ENT: grossly tongue is normal . Neck: no obvious mass . Cardiovascular: S1 S2 normal no gallop . Respiratory: Coarse breath sounds . Abdomen: soft . Skin: no rash seen on limited exam . Musculoskeletal: not rigid . Psychiatric:unable to assess . Neurologic: no seizure no involuntary movements         Lab Data:   Basic Metabolic Panel: Recent Labs  Lab 11/16/19 0606 11/17/19 0556 11/18/19 0534 11/19/19 0552 11/21/19 0445  NA 144 145 149* 150* 139  K 3.5 4.2 3.6 3.3* 4.3  CL 91* 94* 100 99 92*  CO2 40* 39* 38* 39* 35*  GLUCOSE 230* 241* 166* 168* 430*  BUN 28* 39* 36* 40* 60*  CREATININE 0.77 1.10 0.76 1.08 0.97  CALCIUM 8.2* 8.3* 8.5* 7.8* 7.9*    ABG: Recent Labs  Lab 11/16/19 2020 11/16/19 2255 11/17/19 1815 11/17/19 1942 11/19/19 1655  PHART 7.280* 7.309* 7.263* 7.357 7.470*  PCO2ART 89.4* 81.2* 91.5* 72.6* 59.2*  PO2ART 101 97.4 80.0* 64.8* 61.7*  HCO3 41.1* 40.1* 39.9* 40.2* 42.5*  O2SAT 97.0 97.4 94.0 92.9 91.6    Liver Function Tests: No results for input(s): AST, ALT, ALKPHOS, BILITOT, PROT, ALBUMIN in the last 168 hours. No results for input(s): LIPASE, AMYLASE in the last 168 hours. No results for input(s): AMMONIA in the last  168 hours.  CBC: Recent Labs  Lab 11/16/19 0606 11/17/19 0556 11/18/19 0534  WBC 18.2* 23.3* 24.1*  HGB 12.9* 12.5* 12.9*  HCT 40.7 41.7 42.0  MCV 95.8 99.5 97.9  PLT 219 197 221    Cardiac Enzymes: No results for input(s): CKTOTAL, CKMB, CKMBINDEX, TROPONINI in the last 168 hours.  BNP (last 3 results) Recent Labs    11/02/19 1224 11/06/19 1254  BNP 79.7 175.0*    ProBNP (last 3 results) No results for input(s): PROBNP in the last 8760 hours.  Radiological Exams: No results found.  Assessment/Plan Active Problems:   Acute on chronic respiratory failure with hypoxia (HCC)   Pulmonary fibrosis, postinflammatory (HCC)   Acute on chronic heart failure with preserved ejection fraction (HCC)   COVID-19 virus infection   Paroxysmal atrial fibrillation (HCC)   1. Acute on chronic respiratory failure with hypoxia plan is to continue with full support on the ventilator at this time patient's FiO2 is at 80% has not been able to tolerate coming down the FiO2 2. Pulmonary fibrosis advanced severe disease as evident by radiological evaluation this has been already discussed with the family as well as the patient. 3. Acute on chronic congestive heart failure we will continue to monitor fluid status diuresis as tolerated. 4. COVID-19 virus infection resolved 5. Paroxysmal atrial fibrillation currently is rate controlled we are monitoring closely nothing   I have  personally seen and evaluated the patient, evaluated laboratory and imaging results, formulated the assessment and plan and placed orders. The Patient requires high complexity decision making with multiple systems involvement.  Rounds were done with the Respiratory Therapy Director and Staff therapists and discussed with nursing staff also.  Allyne Gee, MD [facility] Pulmonary Critical Care Medicine Sleep Medicine

## 2019-11-22 DIAGNOSIS — I480 Paroxysmal atrial fibrillation: Secondary | ICD-10-CM | POA: Diagnosis not present

## 2019-11-22 DIAGNOSIS — J9621 Acute and chronic respiratory failure with hypoxia: Secondary | ICD-10-CM | POA: Diagnosis not present

## 2019-11-22 DIAGNOSIS — U071 COVID-19: Secondary | ICD-10-CM | POA: Diagnosis not present

## 2019-11-22 DIAGNOSIS — I5033 Acute on chronic diastolic (congestive) heart failure: Secondary | ICD-10-CM | POA: Diagnosis not present

## 2019-11-22 NOTE — Progress Notes (Signed)
Pulmonary Critical Care Medicine [place]   PULMONARY CRITICAL CARE SERVICE  PROGRESS NOTE  Date of Service: 11/22/2019  Karl Mason  LDJ:[id number]  DOB: Aug 14, 1933   DOA: 11/14/2019  Referring Physician: Merton Border, MD  HPI: Karl Mason is a 86 y.o. male seen for follow up of Acute on Chronic Respiratory Failure.  Patient currently is on assist control right now is on 80% FiO2 good saturations are noted PEEP of 5 oxygen requirements are still high advance per respiratory therapy to address the PEEP as well as FiO2 if possible  Medications: Reviewed on Rounds  Physical Exam:  Vitals: Temperature is 96.8 pulse 101 respiratory rate 23 blood pressure is 148/70 saturations 94%  Ventilator Settings on assist control FiO2 80% tidal volume is 565 PEEP 5  . General: Comfortable at this time . Eyes: Grossly normal lids, irises & conjunctiva . ENT: grossly tongue is normal . Neck: no obvious mass . Cardiovascular: S1 S2 normal no gallop . Respiratory: Scattered rhonchi noted bilaterally . Abdomen: soft . Skin: no rash seen on limited exam . Musculoskeletal: not rigid . Psychiatric:unable to assess . Neurologic: no seizure no involuntary movements         Lab Data:   Basic Metabolic Panel: Recent Labs  Lab 11/16/19 0606 11/17/19 0556 11/18/19 0534 11/19/19 0552 11/21/19 0445  NA 144 145 149* 150* 139  K 3.5 4.2 3.6 3.3* 4.3  CL 91* 94* 100 99 92*  CO2 40* 39* 38* 39* 35*  GLUCOSE 230* 241* 166* 168* 430*  BUN 28* 39* 36* 40* 60*  CREATININE 0.77 1.10 0.76 1.08 0.97  CALCIUM 8.2* 8.3* 8.5* 7.8* 7.9*    ABG: Recent Labs  Lab 11/16/19 2020 11/16/19 2255 11/17/19 1815 11/17/19 1942 11/19/19 1655  PHART 7.280* 7.309* 7.263* 7.357 7.470*  PCO2ART 89.4* 81.2* 91.5* 72.6* 59.2*  PO2ART 101 97.4 80.0* 64.8* 61.7*  HCO3 41.1* 40.1* 39.9* 40.2* 42.5*  O2SAT 97.0 97.4 94.0 92.9 91.6    Liver Function Tests: No results for input(s): AST, ALT,  ALKPHOS, BILITOT, PROT, ALBUMIN in the last 168 hours. No results for input(s): LIPASE, AMYLASE in the last 168 hours. No results for input(s): AMMONIA in the last 168 hours.  CBC: Recent Labs  Lab 11/16/19 0606 11/17/19 0556 11/18/19 0534  WBC 18.2* 23.3* 24.1*  HGB 12.9* 12.5* 12.9*  HCT 40.7 41.7 42.0  MCV 95.8 99.5 97.9  PLT 219 197 221    Cardiac Enzymes: No results for input(s): CKTOTAL, CKMB, CKMBINDEX, TROPONINI in the last 168 hours.  BNP (last 3 results) Recent Labs    11/02/19 1224 11/06/19 1254  BNP 79.7 175.0*    ProBNP (last 3 results) No results for input(s): PROBNP in the last 8760 hours.  Radiological Exams: No results found.  Assessment/Plan Active Problems:   Acute on chronic respiratory failure with hypoxia (HCC)   Pulmonary fibrosis, postinflammatory (HCC)   Acute on chronic heart failure with preserved ejection fraction ([place])   COVID-19 virus infection   Paroxysmal atrial fibrillation ([place])   1. Acute on chronic respiratory failure with hypoxia plan try to increase PEEP turndown FiO2 if possible.  Patient remains orally intubated as previously discussed he did not want tracheostomy etc. we are going to reassess situation in 2 weeks since the intubation was done 2. Pulmonary fibrosis advanced severe end-stage disease continue with supportive care prognosis is poor 3. Acute on chronic congestive heart failure monitor fluid status closely 4. COVID-19 virus infection this  has resolved we will continue with supportive care 5. Paroxysmal atrial fibrillation rate is controlled we will continue to monitor   I have personally seen and evaluated the patient, evaluated laboratory and imaging results, formulated the assessment and plan and placed orders. The Patient requires high complexity decision making with multiple systems involvement.  Rounds were done with the Respiratory Therapy Director and Staff therapists and discussed with nursing staff also.   Time 35 minutes patient is critically ill in danger of cardiac arrest and death  Yevonne Pax, MD [facility] Pulmonary Critical Care Medicine Sleep Medicine

## 2019-11-23 ENCOUNTER — Other Ambulatory Visit (HOSPITAL_COMMUNITY): Payer: Medicare Other

## 2019-11-23 DIAGNOSIS — I480 Paroxysmal atrial fibrillation: Secondary | ICD-10-CM | POA: Diagnosis not present

## 2019-11-23 DIAGNOSIS — I5033 Acute on chronic diastolic (congestive) heart failure: Secondary | ICD-10-CM | POA: Diagnosis not present

## 2019-11-23 DIAGNOSIS — U071 COVID-19: Secondary | ICD-10-CM | POA: Diagnosis not present

## 2019-11-23 DIAGNOSIS — J9621 Acute and chronic respiratory failure with hypoxia: Secondary | ICD-10-CM | POA: Diagnosis not present

## 2019-11-23 LAB — BASIC METABOLIC PANEL
Anion gap: 11 (ref 5–15)
BUN: 67 mg/dL — ABNORMAL HIGH (ref 8–23)
CO2: 46 mmol/L — ABNORMAL HIGH (ref 22–32)
Calcium: 8.3 mg/dL — ABNORMAL LOW (ref 8.9–10.3)
Chloride: 90 mmol/L — ABNORMAL LOW (ref 98–111)
Creatinine, Ser: 0.89 mg/dL (ref 0.61–1.24)
GFR calc Af Amer: >60 mL/min (ref >60)
GFR calc non Af Amer: >60 mL/min (ref >60)
Glucose, Bld: 367 mg/dL — ABNORMAL HIGH (ref 70–99)
Potassium: 4.7 mmol/L (ref 3.5–5.1)
Sodium: 147 mmol/L — ABNORMAL HIGH (ref 135–145)

## 2019-11-23 LAB — CBC
HCT: 37.4 % — ABNORMAL LOW (ref 39.0–52.0)
Hemoglobin: 11.7 g/dL — ABNORMAL LOW (ref 13.0–17.0)
MCH: 30.5 pg (ref 26.0–34.0)
MCHC: 31.3 g/dL (ref 30.0–36.0)
MCV: 97.7 fL (ref 80.0–100.0)
Platelets: UNDETERMINED 10*3/uL (ref 150–400)
RBC: 3.83 MIL/uL — ABNORMAL LOW (ref 4.22–5.81)
RDW: 17.6 % — ABNORMAL HIGH (ref 11.5–15.5)
WBC: 19.2 10*3/uL — ABNORMAL HIGH (ref 4.0–10.5)
nRBC: 0.1 % (ref 0.0–0.2)

## 2019-11-23 NOTE — Progress Notes (Signed)
Pulmonary Critical Care Medicine [facility] [place]   PULMONARY CRITICAL CARE SERVICE  PROGRESS NOTE  Date of Service: 11/23/2019  Karl Mason  DXI:[id number]  DOB: 29-Jan-1933   DOA: 10/29/2019  Referring Physician: Carron Curie, MD  HPI: Karl Mason is a 86 y.o. male seen for follow up of Acute on Chronic Respiratory Failure.  Patient currently is on assist control has been requiring 75% FiO2.  Remains on a PEEP of 5  Medications: Reviewed on Rounds  Physical Exam:  Vitals: Temperature is 96.8 pulse 97 respiratory 25 blood pressure is 136/55 saturations 96%  Ventilator Settings mode ventilation assist control FiO2 75% tidal volume 500 PEEP 5  . General: Comfortable at this time . Eyes: Grossly normal lids, irises & conjunctiva . ENT: grossly tongue is normal . Neck: no obvious mass . Cardiovascular: S1 S2 normal no gallop . Respiratory: Scattered rhonchi expansion is equal at this time . Abdomen: soft . Skin: no rash seen on limited exam . Musculoskeletal: not rigid . Psychiatric:unable to assess . Neurologic: no seizure no involuntary movements         Lab Data:   Basic Metabolic Panel: Recent Labs  Lab 11/17/19 0556 11/18/19 0534 11/19/19 0552 11/21/19 0445 11/23/19 0508  NA 145 149* 150* 139 147*  K 4.2 3.6 3.3* 4.3 4.7  CL 94* 100 99 92* 90*  CO2 39* 38* 39* 35* 46*  GLUCOSE 241* 166* 168* 430* 367*  BUN 39* 36* 40* 60* 67*  CREATININE 1.10 0.76 1.08 0.97 0.89  CALCIUM 8.3* 8.5* 7.8* 7.9* 8.3*    ABG: Recent Labs  Lab 11/16/19 2020 11/16/19 2255 11/17/19 1815 11/17/19 1942 11/19/19 1655  PHART 7.280* 7.309* 7.263* 7.357 7.470*  PCO2ART 89.4* 81.2* 91.5* 72.6* 59.2*  PO2ART 101 97.4 80.0* 64.8* 61.7*  HCO3 41.1* 40.1* 39.9* 40.2* 42.5*  O2SAT 97.0 97.4 94.0 92.9 91.6    Liver Function Tests: No results for input(s): AST, ALT, ALKPHOS, BILITOT, PROT, ALBUMIN in the last 168 hours. No results for input(s): LIPASE, AMYLASE in the  last 168 hours. No results for input(s): AMMONIA in the last 168 hours.  CBC: Recent Labs  Lab 11/17/19 0556 11/18/19 0534 11/23/19 0508  WBC 23.3* 24.1* 19.2*  HGB 12.5* 12.9* 11.7*  HCT 41.7 42.0 37.4*  MCV 99.5 97.9 97.7  PLT 197 221 PLATELET CLUMPS NOTED ON SMEAR, UNABLE TO ESTIMATE    Cardiac Enzymes: No results for input(s): CKTOTAL, CKMB, CKMBINDEX, TROPONINI in the last 168 hours.  BNP (last 3 results) Recent Labs    11/02/19 1224 11/06/19 1254  BNP 79.7 175.0*    ProBNP (last 3 results) No results for input(s): PROBNP in the last 8760 hours.  Radiological Exams: DG Chest Port 1 View  Result Date: 11/23/2019 CLINICAL DATA:  Respiratory failure. EXAM: PORTABLE CHEST 1 VIEW COMPARISON:  Chest x-ray 11/19/2019 FINDINGS: Endotracheal tube is stable 6 cm above the carina. Side port of the NG tube is in the stomach. The heart is enlarged, exaggerated by low lung volumes. Diffuse interstitial and airspace disease is similar to the prior exam. IMPRESSION: 1. Stable appearance of diffuse interstitial and airspace disease. This may represent edema or infection. 2. Stable cardiomegaly. Electronically Signed   By: Marin Roberts M.D.   On: 11/23/2019 07:25    Assessment/Plan Active Problems:   Acute on chronic respiratory failure with hypoxia (HCC)   Pulmonary fibrosis, postinflammatory (HCC)   Acute on chronic heart failure with preserved ejection fraction (HCC)  COVID-19 virus infection   Paroxysmal atrial fibrillation ([place])   1. Acute on chronic respiratory failure hypoxia we will continue with full support on the ventilator right now is on 75% FiO2 continue secretion management pulmonary toilet. 2. Pulmonary fibrosis no change poor prognosis advanced disease 3. Acute on chronic heart failure right now we are monitoring fluid status continue with supportive care 4. COVID-19 virus infection treated in resolution phase but patient has severe underlying pulmonary  disease 5. Paroxysmal atrial fibrillation rate is controlled we will continue to follow along   I have personally seen and evaluated the patient, evaluated laboratory and imaging results, formulated the assessment and plan and placed orders. The Patient requires high complexity decision making with multiple systems involvement.  Rounds were done with the Respiratory Therapy Director and Staff therapists and discussed with nursing staff also.  Allyne Gee, MD [facility] Pulmonary Critical Care Medicine Sleep Medicine

## 2019-11-24 DIAGNOSIS — J9621 Acute and chronic respiratory failure with hypoxia: Secondary | ICD-10-CM | POA: Diagnosis not present

## 2019-11-24 DIAGNOSIS — I5033 Acute on chronic diastolic (congestive) heart failure: Secondary | ICD-10-CM | POA: Diagnosis not present

## 2019-11-24 DIAGNOSIS — U071 COVID-19: Secondary | ICD-10-CM | POA: Diagnosis not present

## 2019-11-24 DIAGNOSIS — I480 Paroxysmal atrial fibrillation: Secondary | ICD-10-CM | POA: Diagnosis not present

## 2019-11-24 NOTE — Progress Notes (Signed)
Pulmonary Critical Care Medicine [facility] [place]   PULMONARY CRITICAL CARE SERVICE  PROGRESS NOTE  Date of Service: 11/24/2019  Karl Mason  WUJ:[id number]  DOB: [birth year]   DOA: 10/31/2019  Referring Physician: Carron Curie, MD  HPI: Karl Mason is a 86 y.o. male seen for follow up of Acute on Chronic Respiratory Failure.  Patient remains critically ill continues to do poorly remains orally intubated his oxygen requirements have gone up to 90% at this point  Medications: Reviewed on Rounds  Physical Exam:  Vitals: Temperature 98.4 pulse 96 respiratory rate is 30 blood pressure is 162/67 saturations 91%  Ventilator Settings mode ventilation assist control FiO2 90% tidal volume 538 PEEP 5  . General: Comfortable at this time . Eyes: Grossly normal lids, irises & conjunctiva . ENT: grossly tongue is normal . Neck: no obvious mass . Cardiovascular: S1 S2 normal no gallop . Respiratory: Coarse breath sounds bilaterally . Abdomen: soft . Skin: no rash seen on limited exam . Musculoskeletal: not rigid . Psychiatric:unable to assess . Neurologic: no seizure no involuntary movements         Lab Data:   Basic Metabolic Panel: Recent Labs  Lab 11/18/19 0534 11/19/19 0552 11/21/19 0445 11/23/19 0508  NA 149* 150* 139 147*  K 3.6 3.3* 4.3 4.7  CL 100 99 92* 90*  CO2 38* 39* 35* 46*  GLUCOSE 166* 168* 430* 367*  BUN 36* 40* 60* 67*  CREATININE 0.76 1.08 0.97 0.89  CALCIUM 8.5* 7.8* 7.9* 8.3*    ABG: Recent Labs  Lab 11/17/19 1815 11/17/19 1942 11/19/19 1655  PHART 7.263* 7.357 7.470*  PCO2ART 91.5* 72.6* 59.2*  PO2ART 80.0* 64.8* 61.7*  HCO3 39.9* 40.2* 42.5*  O2SAT 94.0 92.9 91.6    Liver Function Tests: No results for input(s): AST, ALT, ALKPHOS, BILITOT, PROT, ALBUMIN in the last 168 hours. No results for input(s): LIPASE, AMYLASE in the last 168 hours. No results for input(s): AMMONIA in the last 168 hours.  CBC: Recent Labs  Lab  11/18/19 0534 11/23/19 0508  WBC 24.1* 19.2*  HGB 12.9* 11.7*  HCT 42.0 37.4*  MCV 97.9 97.7  PLT 221 PLATELET CLUMPS NOTED ON SMEAR, UNABLE TO ESTIMATE    Cardiac Enzymes: No results for input(s): CKTOTAL, CKMB, CKMBINDEX, TROPONINI in the last 168 hours.  BNP (last 3 results) Recent Labs    11/02/19 1224 11/06/19 1254  BNP 79.7 175.0*    ProBNP (last 3 results) No results for input(s): PROBNP in the last 8760 hours.  Radiological Exams: DG Chest Port 1 View  Result Date: 11/23/2019 CLINICAL DATA:  Respiratory failure. EXAM: PORTABLE CHEST 1 VIEW COMPARISON:  Chest x-ray 11/19/2019 FINDINGS: Endotracheal tube is stable 6 cm above the carina. Side port of the NG tube is in the stomach. The heart is enlarged, exaggerated by low lung volumes. Diffuse interstitial and airspace disease is similar to the prior exam. IMPRESSION: 1. Stable appearance of diffuse interstitial and airspace disease. This may represent edema or infection. 2. Stable cardiomegaly. Electronically Signed   By: Marin Roberts M.D.   On: 11/23/2019 07:25    Assessment/Plan Active Problems:   Acute on chronic respiratory failure with hypoxia (HCC)   Pulmonary fibrosis, postinflammatory (HCC)   Acute on chronic heart failure with preserved ejection fraction (HCC)   COVID-19 virus infection   Paroxysmal atrial fibrillation (HCC)   1. Acute on chronic respiratory failure with hypoxia we will continue with assist control titrate oxygen down as tolerated.  His oxygen requirements have in fact been coming up.  Chest x-ray does not show any significant improvement from the perspective of infection pulmonary fibrosis.  Overall as already discussed with the patient prior to intubation prognosis is poor 2. COVID-19 virus infection treated resolving We will continue with supportive care 3. Pulmonary fibrosis severe advanced disease prognosis poor 4. Acute on chronic congestive heart failure monitoring fluid  status 5. Paroxysmal atrial fibrillation rate has been controlled   I have personally seen and evaluated the patient, evaluated laboratory and imaging results, formulated the assessment and plan and placed orders. The Patient requires high complexity decision making with multiple systems involvement.  Rounds were done with the Respiratory Therapy Director and Staff therapists and discussed with nursing staff also.  Time 35 minutes patient critically ill in danger of cardiac arrest and death  Allyne Gee, MD [facility] Pulmonary Critical Care Medicine Sleep Medicine

## 2019-11-25 ENCOUNTER — Other Ambulatory Visit (HOSPITAL_COMMUNITY): Payer: Medicare Other

## 2019-11-25 MED FILL — Medication: Qty: 1 | Status: AC

## 2019-12-15 DEATH — deceased

## 2021-04-27 IMAGING — DX DG CHEST 1V PORT
1 series · 1 of 1 positions shown · non-contrast
Comparison: Portable exam 9996 hours compared to 10/18/2019

CLINICAL DATA: Respiratory failure, increased shortness of breath

EXAM:
PORTABLE CHEST 1 VIEW

[chest ap]
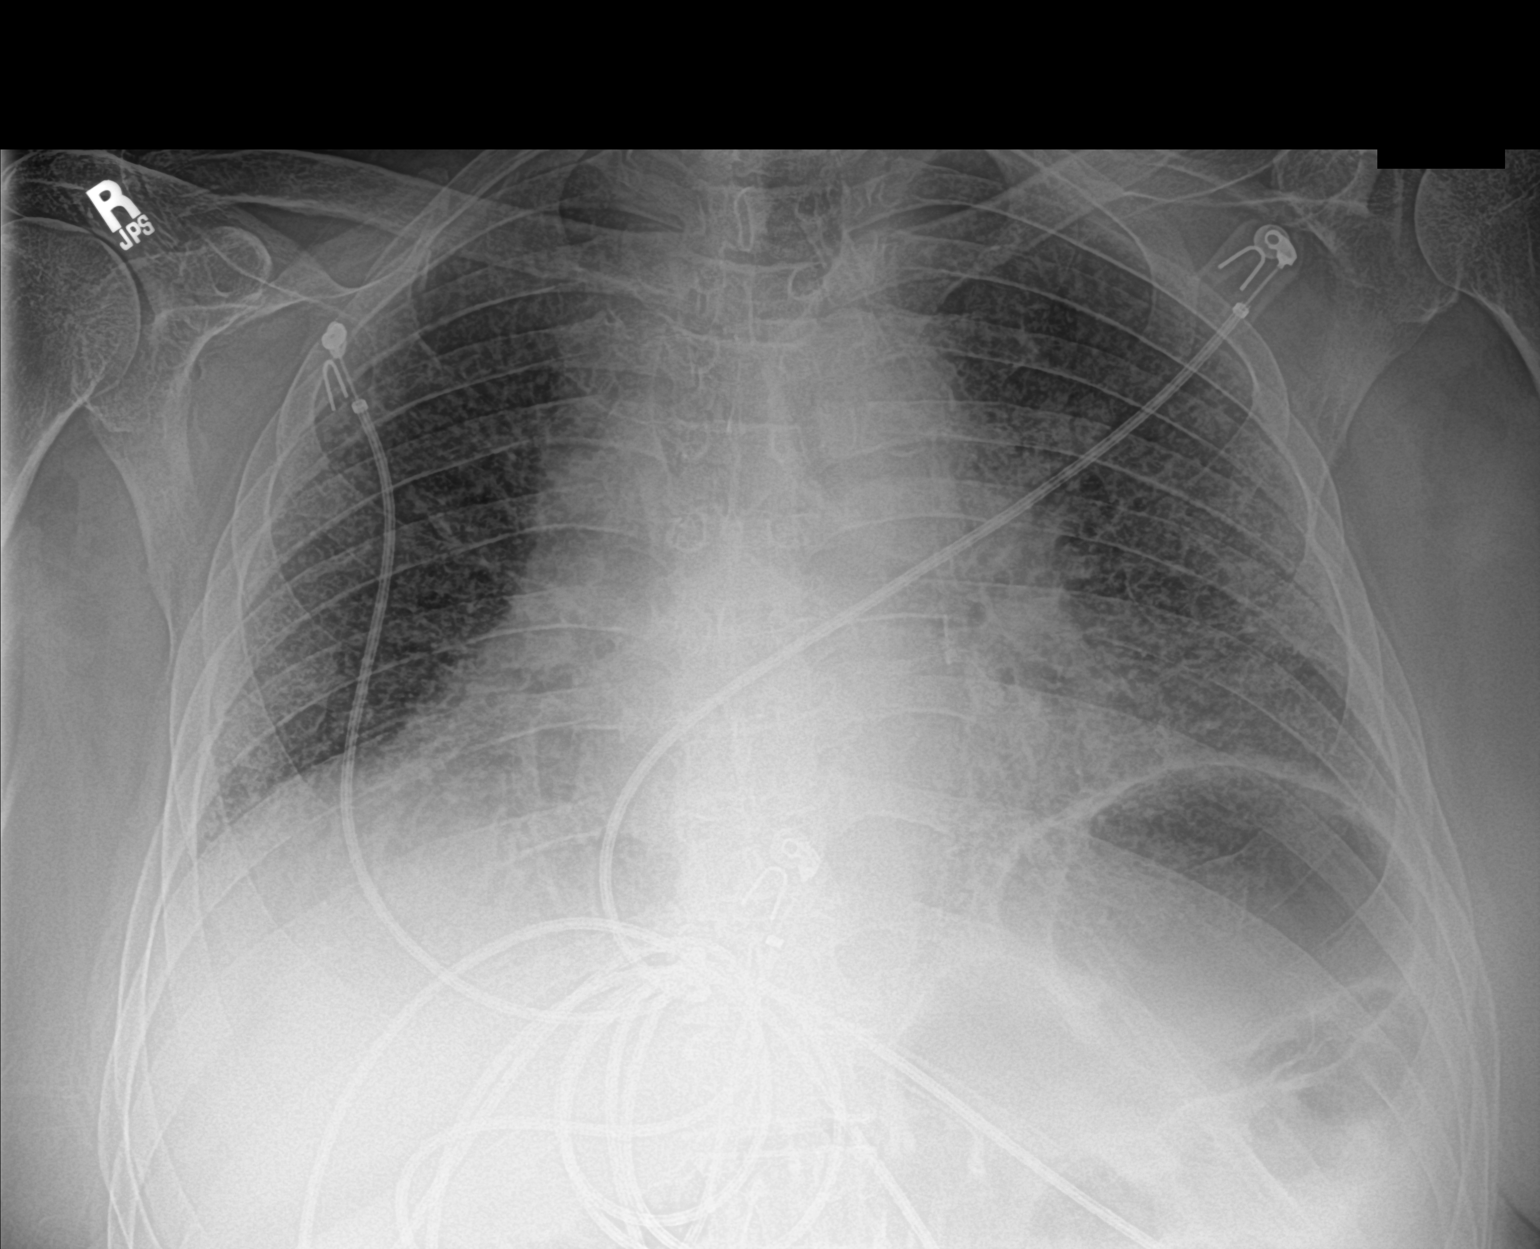

[1 of 1 positions shown; findings below may reference images not displayed]

FINDINGS: Enlargement of cardiac silhouette.

Atherosclerotic calcification aorta.

Mediastinal prominent but unchanged.

Diffuse interstitial infiltrates throughout the periphery of both
lungs similar to prior exam.

No superimposed acute infiltrate, pleural effusion or pneumothorax.

Bones demineralized.
IMPRESSION: Enlargement of cardiac silhouette.

Chronic interstitial lung disease.

No acute abnormalities.

## 2021-04-30 IMAGING — US US RENAL
1 series · 14 of 20 positions shown · non-contrast
Comparison: CT abdomen pelvis dated 10/24/2019.

CLINICAL DATA: 87-year-old male with flank pain.

EXAM:
RENAL / URINARY TRACT ULTRASOUND COMPLETE

[Series 1: us renal · 14 of 20 slices shown]
[im 1/20]
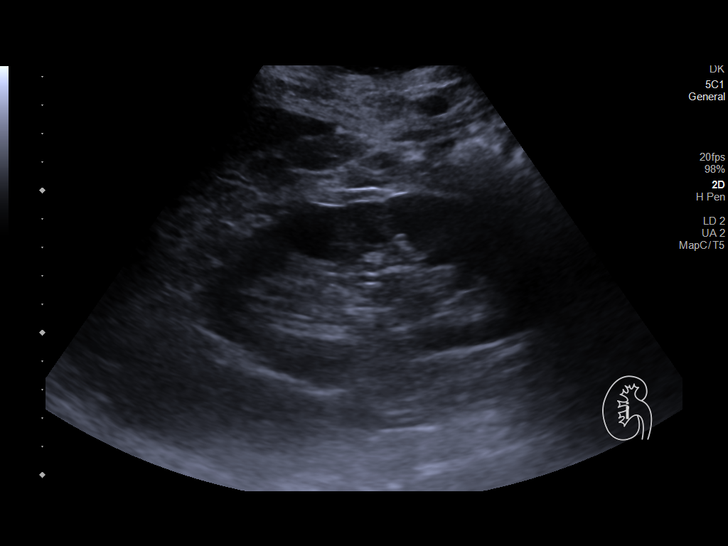
[im 3/20]
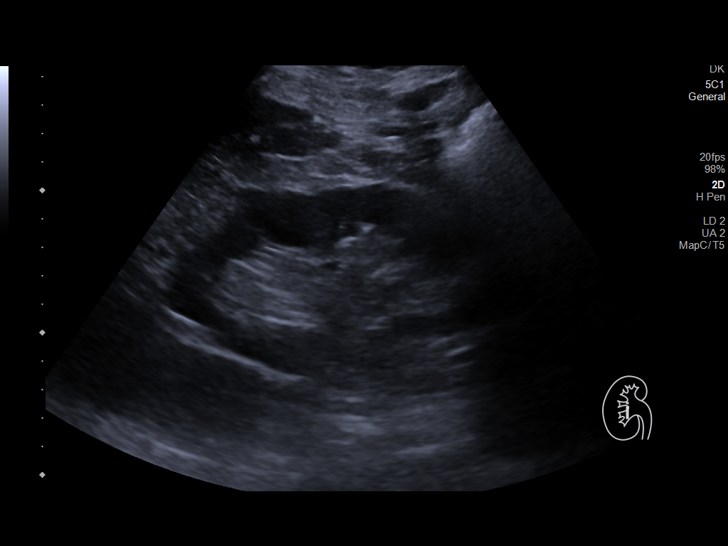
[im 4/20]
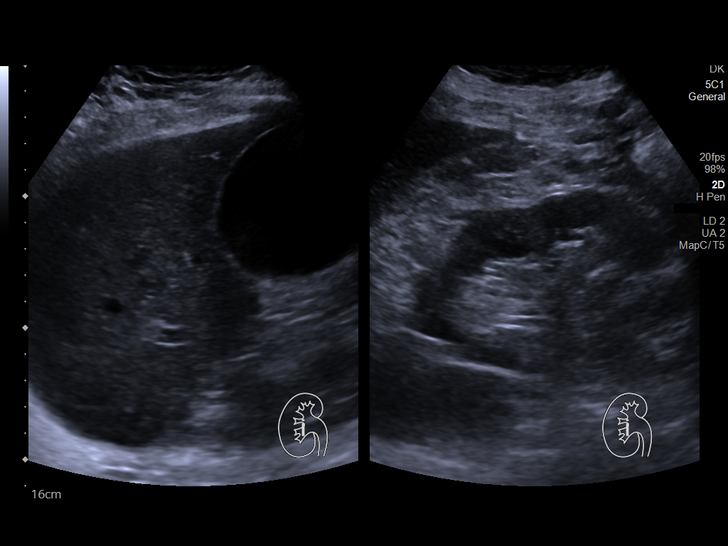
[im 6/20]
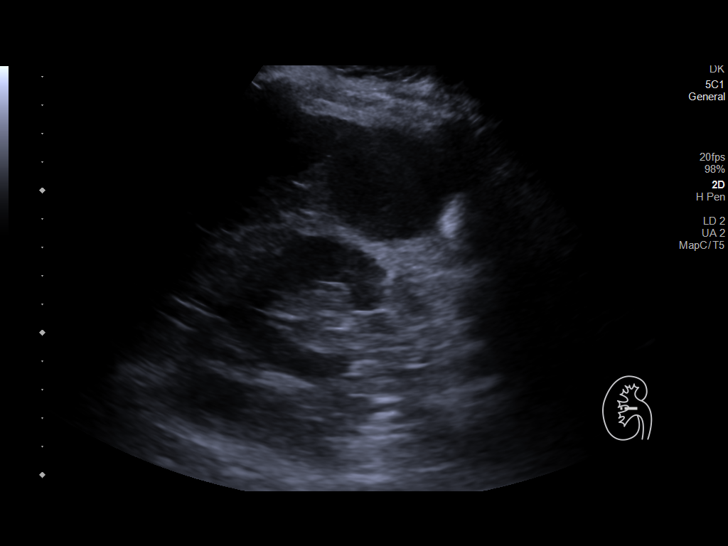
[im 7/20]
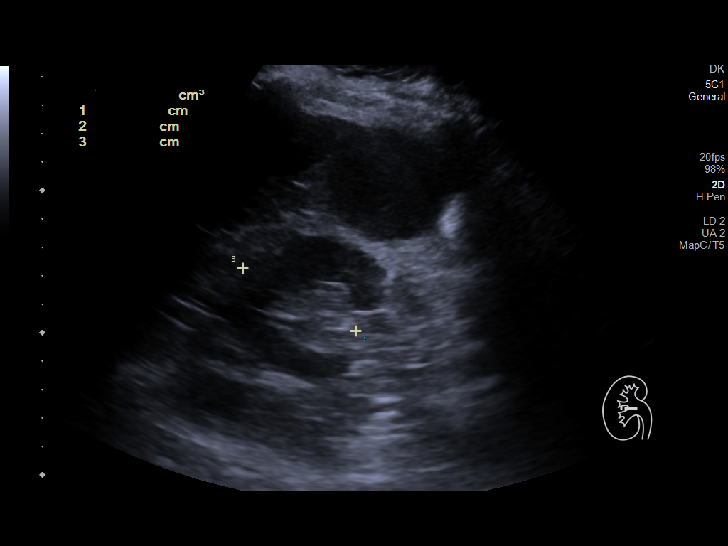
[im 8/20]
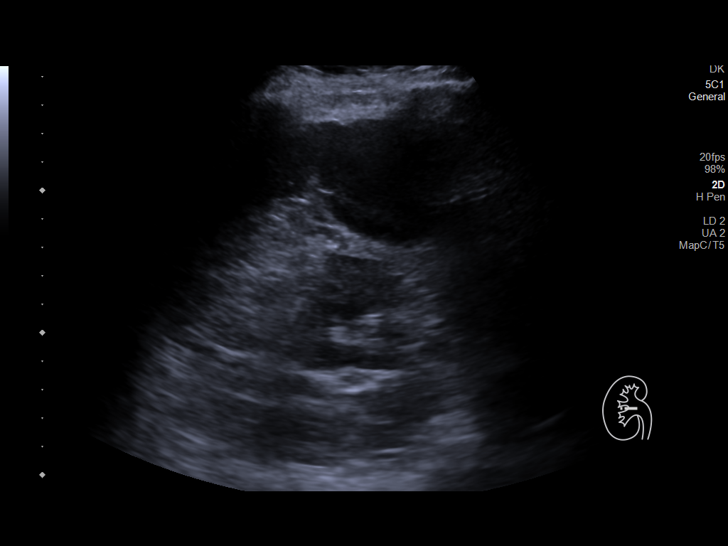
[im 10/20]
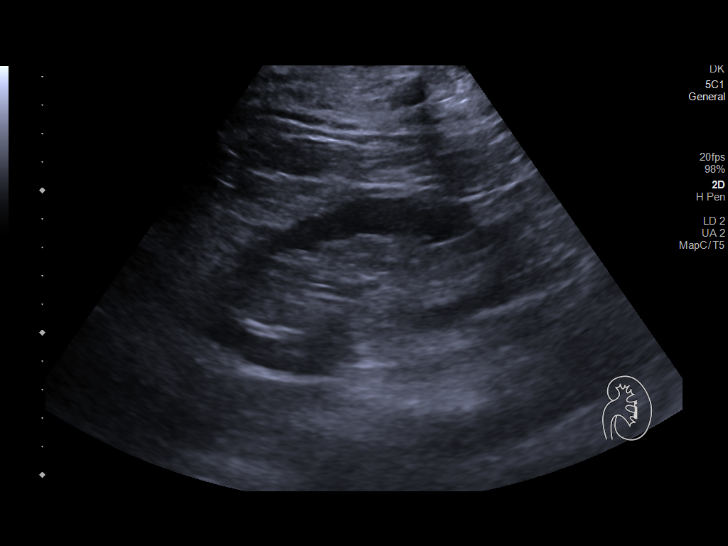
[im 11/20]
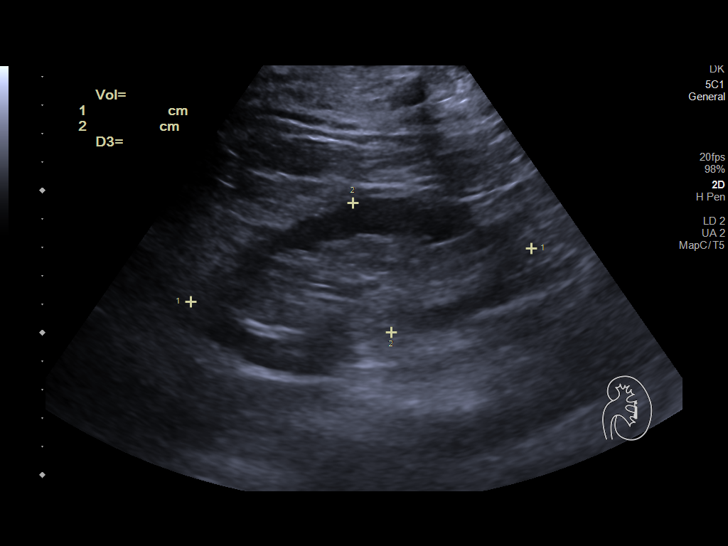
[im 13/20]
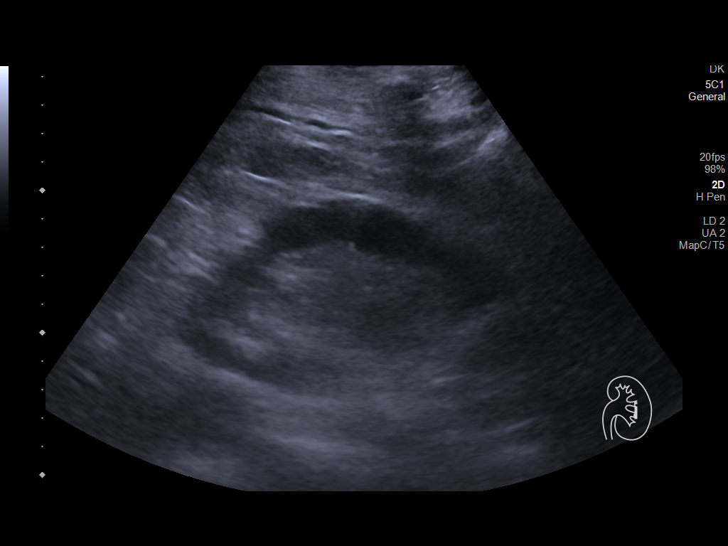
[im 14/20]
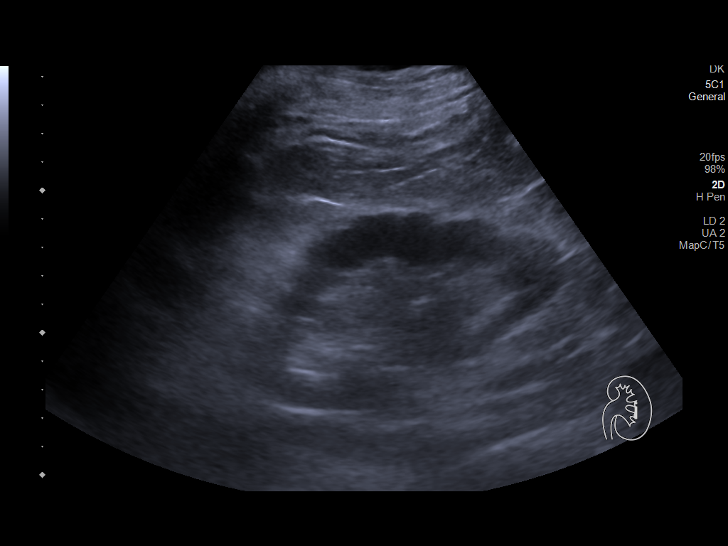
[im 16/20]
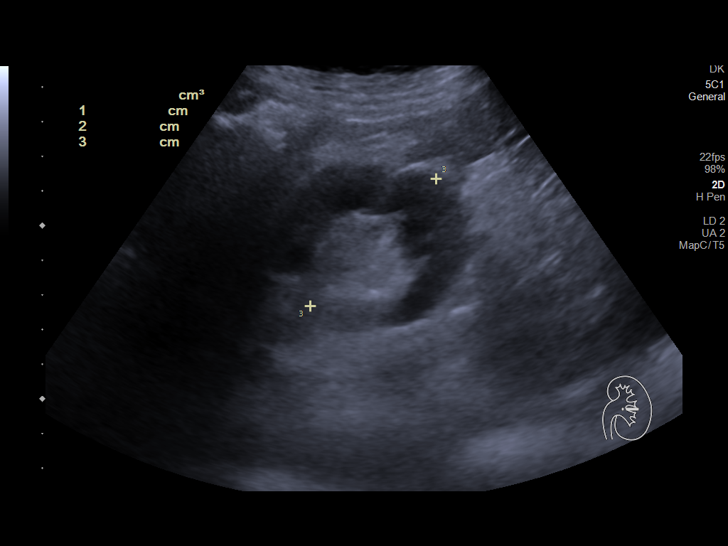
[im 17/20]
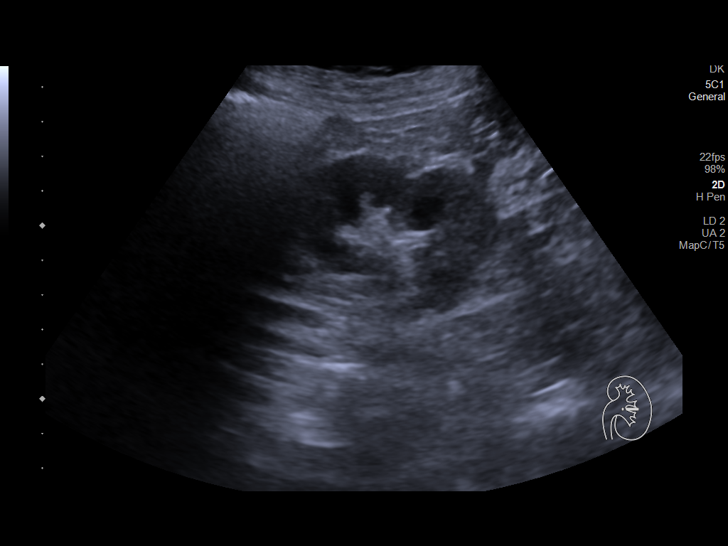
[im 18/20]
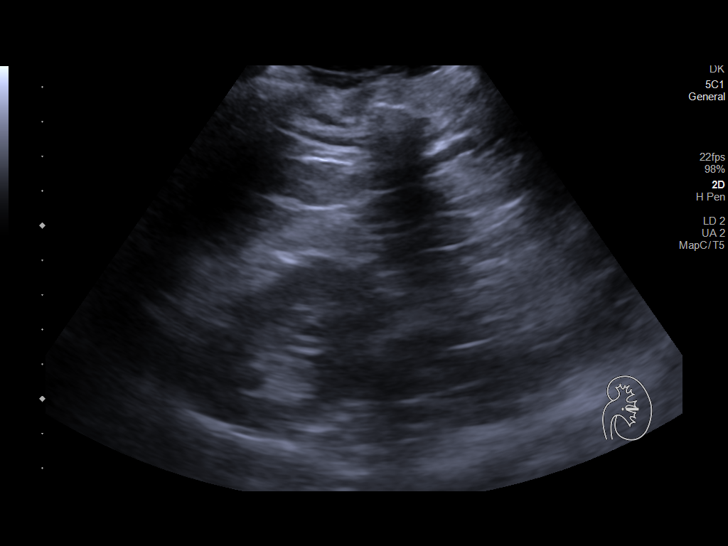
[im 20/20]
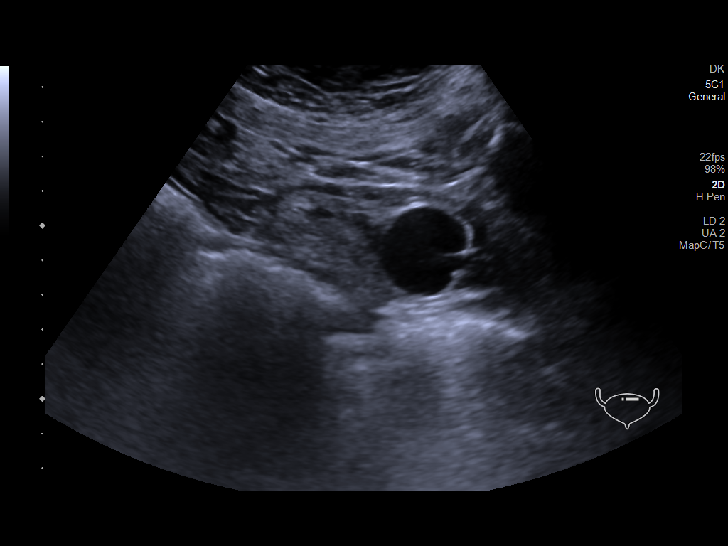

[14 of 20 positions shown; findings below may reference images not displayed]

FINDINGS: Right Kidney:

Renal measurements: 11.8 x 5.9 x 4.5 cm = volume: 166 mL. Normal
echogenicity. No hydronephrosis or shadowing stone.

Left Kidney:

Renal measurements: 12.1 x 4.7 x 5.2 cm = volume: 155 mL. Normal
echogenicity. No hydronephrosis or shadowing stone.

Bladder:

The urinary bladder is decompressed around a Foley catheter.

Other:

None.
IMPRESSION: Unremarkable renal ultrasound for age. No hydronephrosis or
shadowing stone.

## 2021-05-07 IMAGING — US US ABDOMEN LIMITED
1 series · 14 of 25 positions shown · non-contrast
Comparison: CT from 10/24/2019

CLINICAL DATA: Abdominal pain for 1 night

EXAM:
ULTRASOUND ABDOMEN LIMITED RIGHT UPPER QUADRANT

[Series 1: us abdomen limited · 14 of 51 slices shown]
[im 1/51]
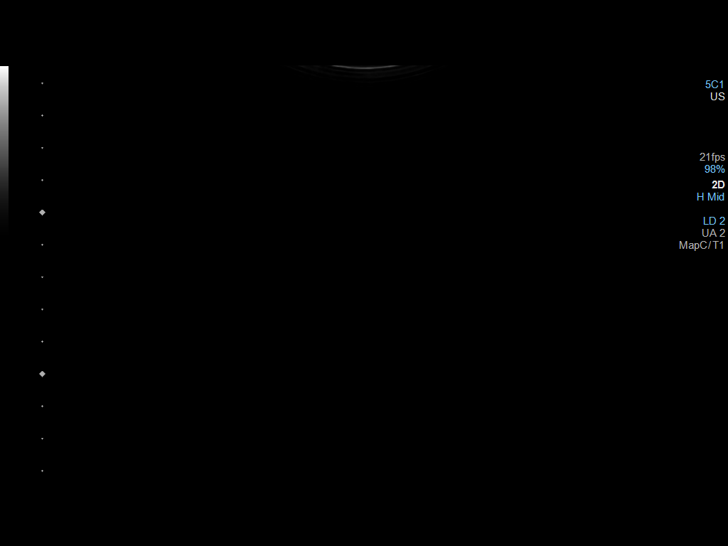
[im 5/51]
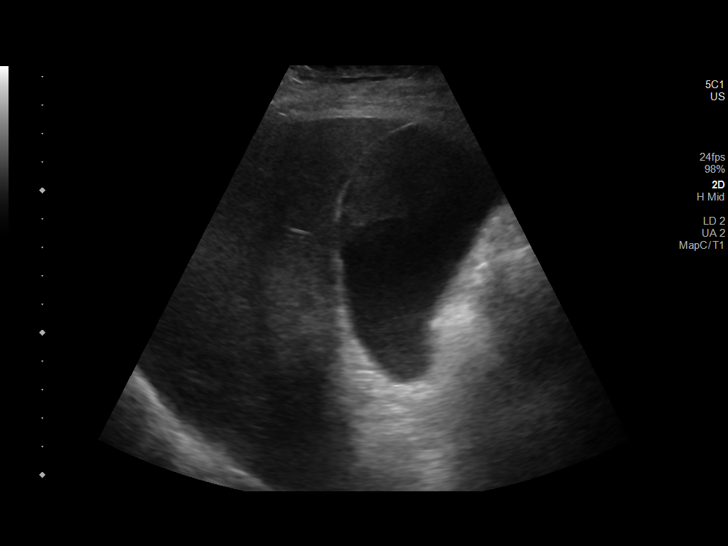
[im 9/51]
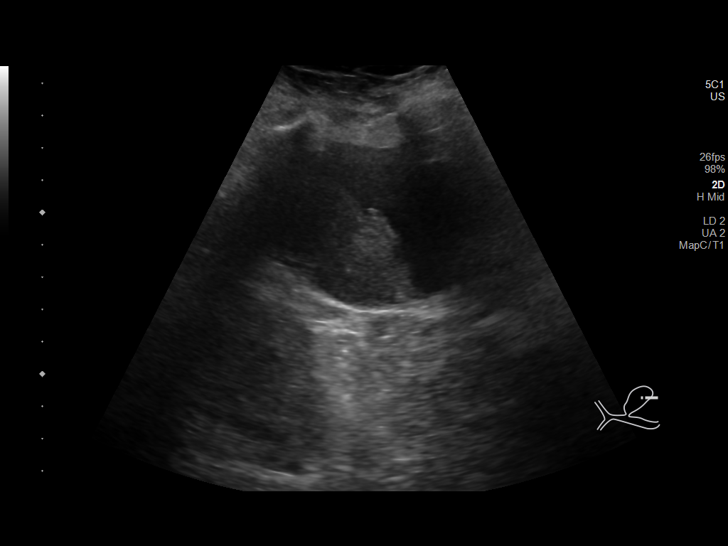
[im 13/51]
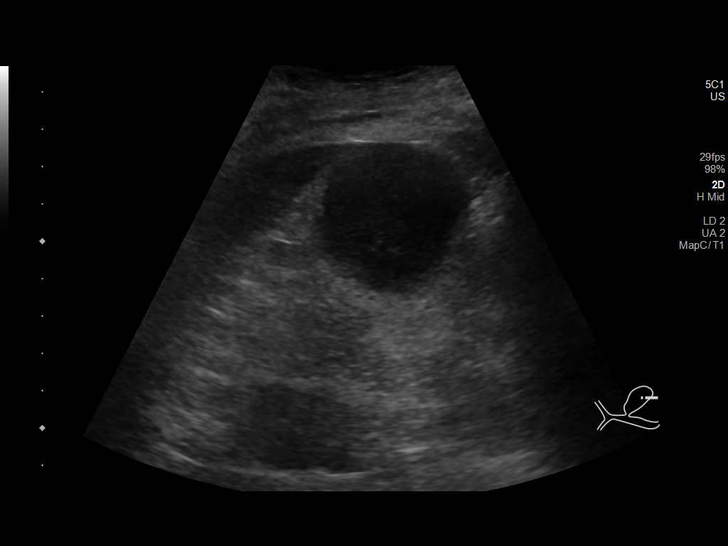
[im 17/51]
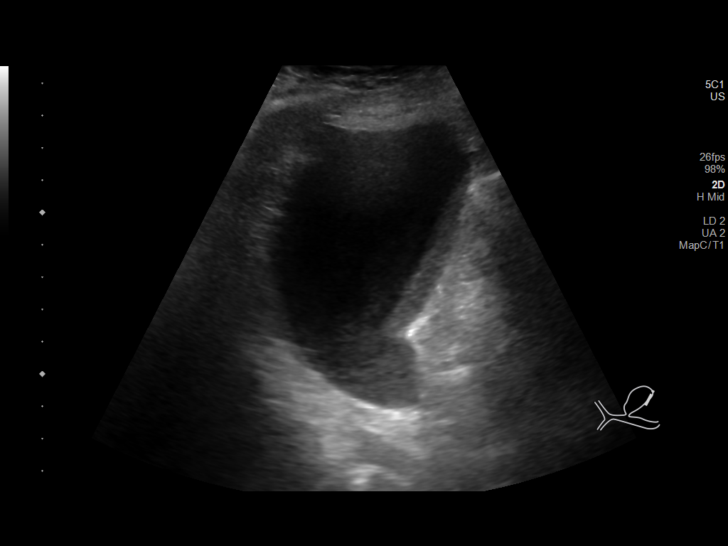
[im 19/51]
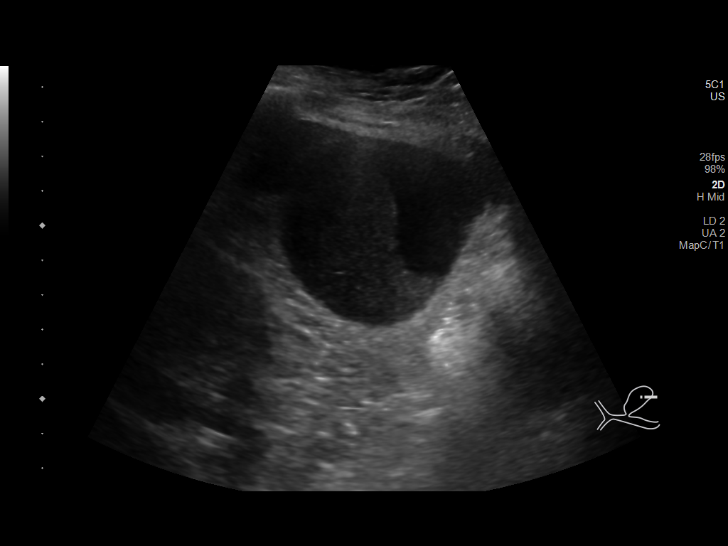
[im 23/51]
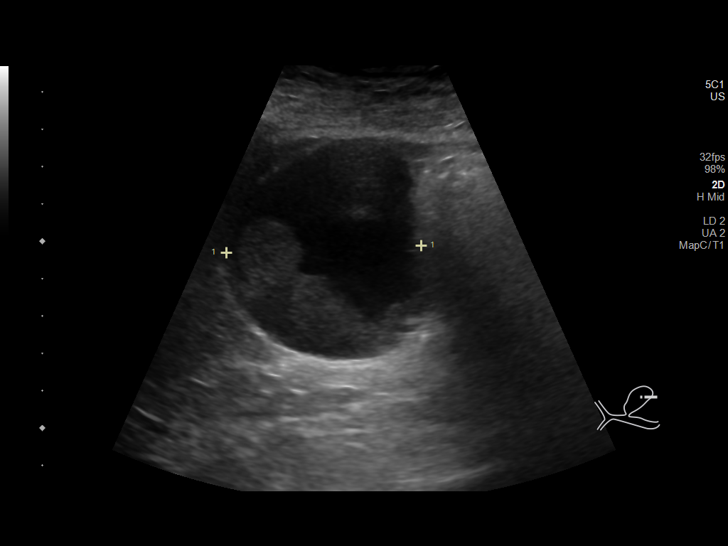
[im 28/51]
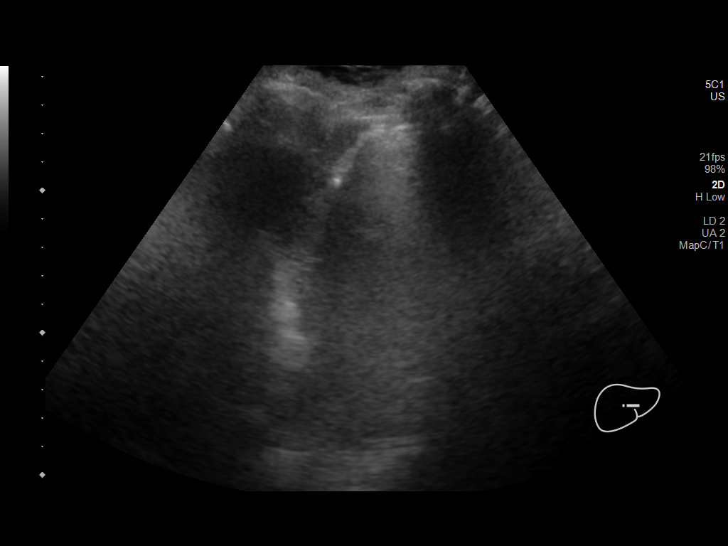
[im 32/51]
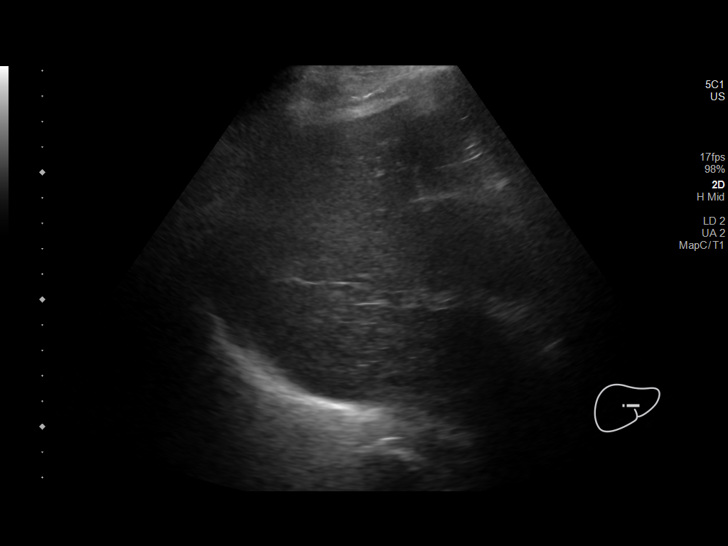
[im 34/51]
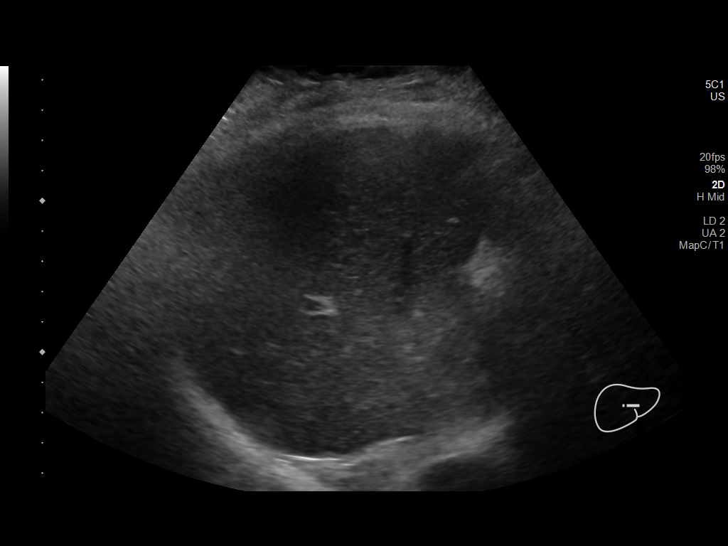
[im 38/51]
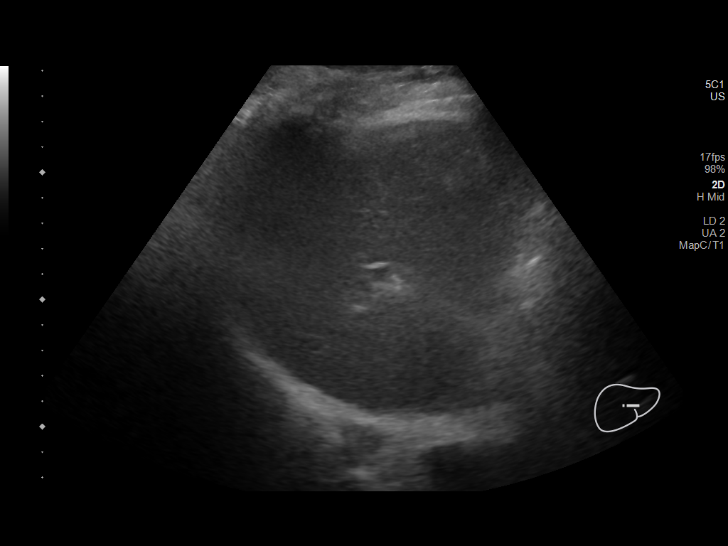
[im 42/51]
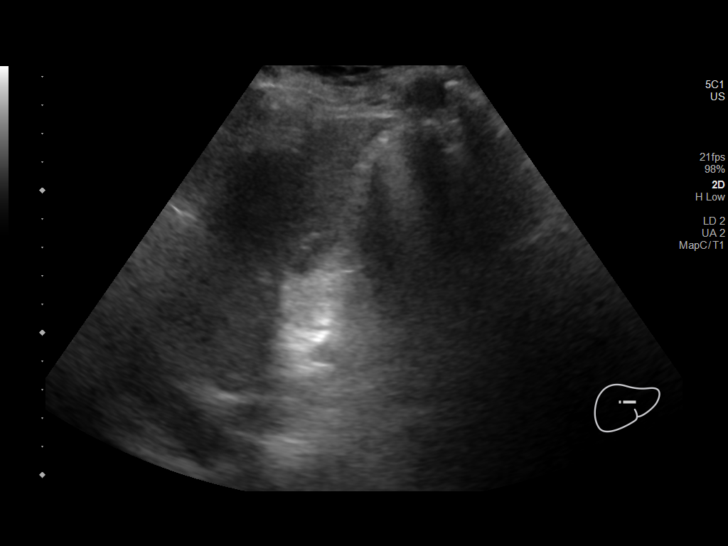
[im 46/51]
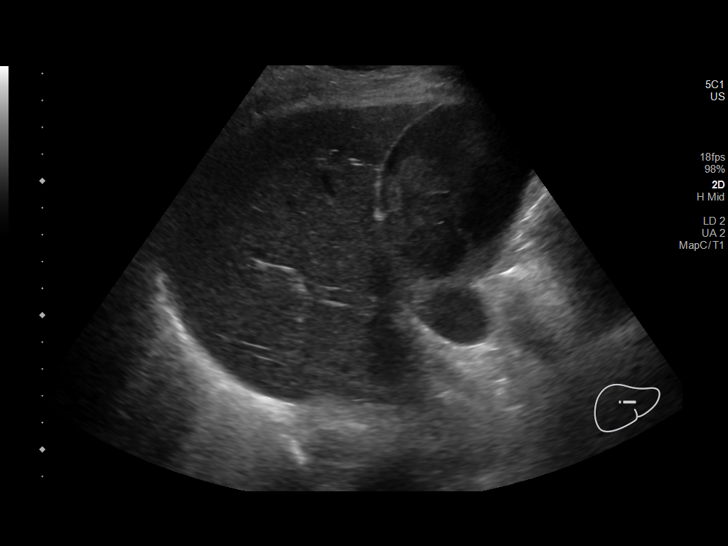
[im 51/51]
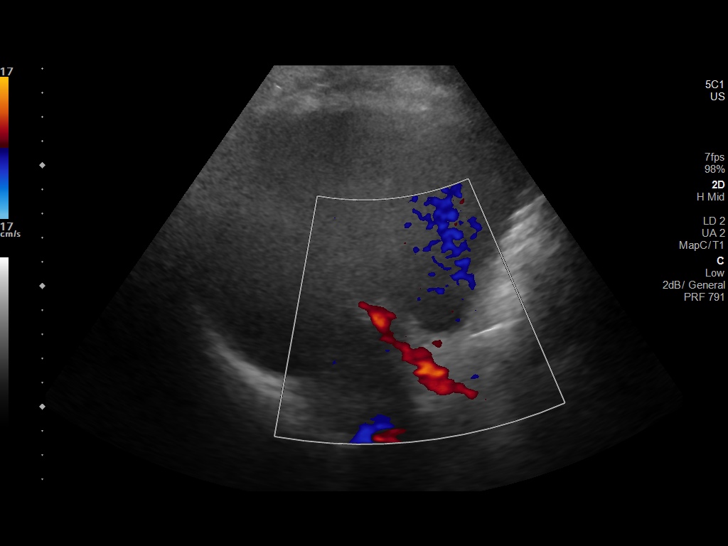

[14 of 25 positions shown; findings below may reference images not displayed]

FINDINGS: Gallbladder:

Gallbladder is again well distended without wall thickening or
pericholecystic fluid. No gallstones are seen. Tumefactive sludge is
noted within the gallbladder.

Common bile duct:

Diameter: 3.4 mm.

Liver:

Somewhat limited due to overlying bowel gas and poor acoustical
window. No acute abnormality is noted. Portal vein is patent on
color Doppler imaging with normal direction of blood flow towards
the liver.

Other: None.
IMPRESSION: Well distended gallbladder with tumefactive sludge. No definitive
cholelithiasis is seen.

Limited views of the liver although no acute abnormality is noted.

## 2021-05-09 IMAGING — DX DG CHEST 1V PORT
1 series · 1 of 1 positions shown · non-contrast
Comparison: Chest x-rays dated 11/11/2019, 11/02/2019 and
09/12/2019 and CT scan dated 11/07/2019

CLINICAL DATA: Hypoxemia. Shortness of breath. Pulmonary fibrosis.
The patient had [id number] in May 2019.

EXAM:
PORTABLE CHEST 1 VIEW

[chest ap]
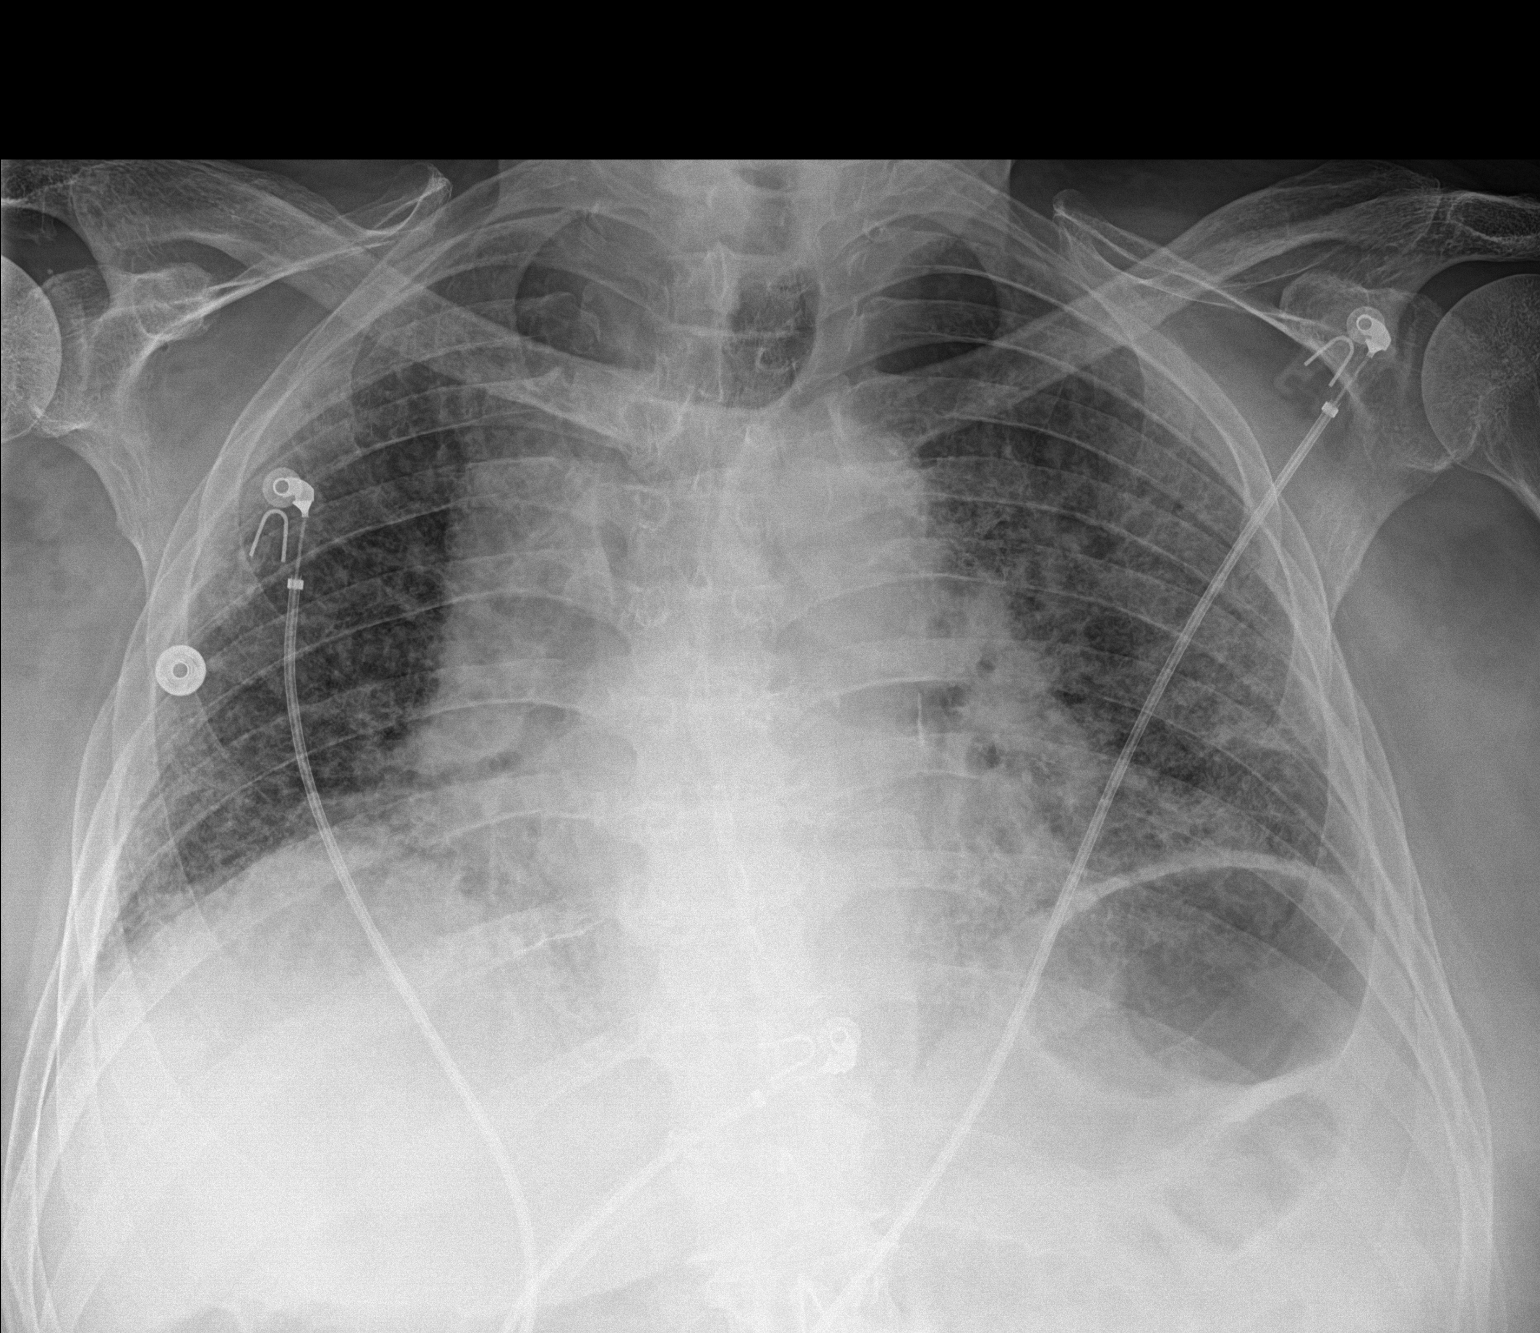

[1 of 1 positions shown; findings below may reference images not displayed]

FINDINGS: Heart size is within normal limits considering the AP portable
technique. Diffuse accentuation of the interstitial markings is
essentially unchanged. No effusions. Pulmonary vascularity is within
normal limits considering the shallow inspiration.

No acute bone abnormality.
IMPRESSION: No significant change in the diffuse accentuation of the
interstitial markings consistent with the patient's history of
pulmonary fibrosis.

## 2021-05-13 IMAGING — DX DG ABD PORTABLE 1V
1 series · 1 of 1 positions shown · non-contrast
Comparison: 10/27/2019, chest x-ray from the previous day.

CLINICAL DATA: Check gastric catheter placement

EXAM:
PORTABLE ABDOMEN - 1 VIEW

[abdomen kub]
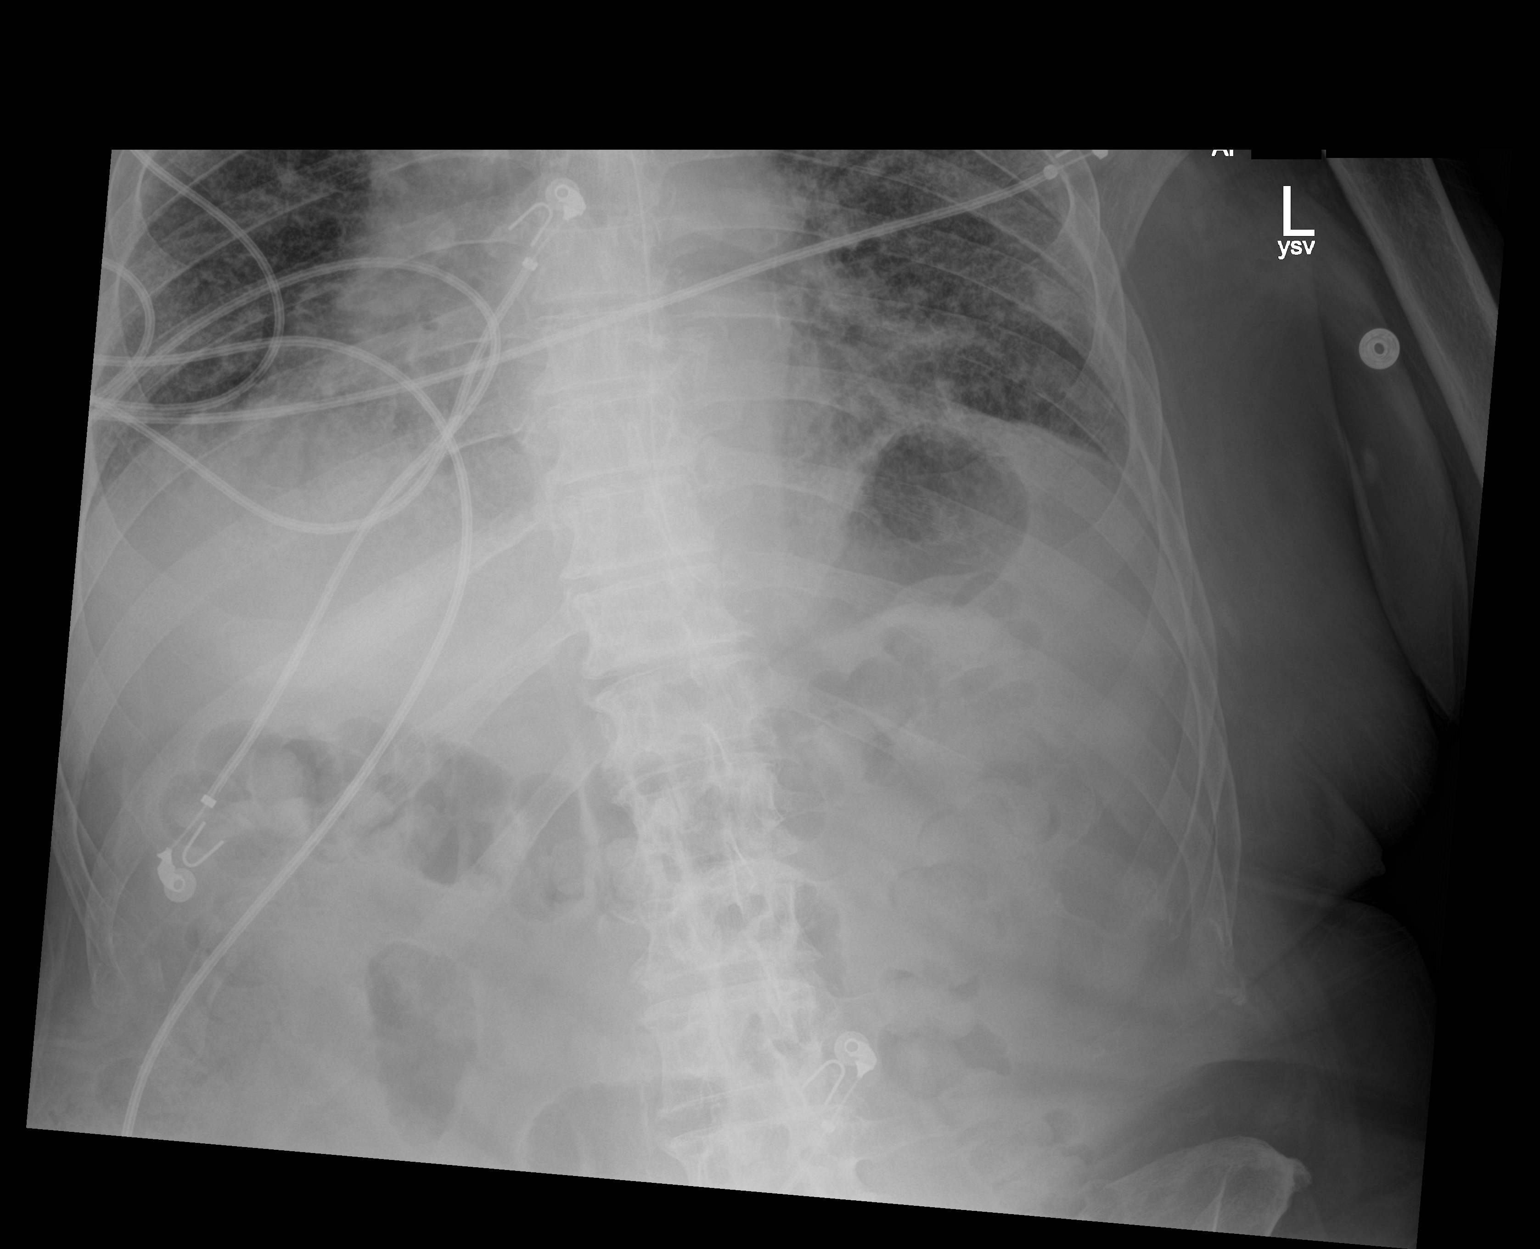

[1 of 1 positions shown; findings below may reference images not displayed]

FINDINGS: Gastric catheter is now seen with the tip in the distal esophagus
just above the gastroesophageal junction. This should be advanced
several cm. No obstructive changes in the abdomen are seen.
Scattered parenchymal opacities are again noted similar to that seen
on recent chest x-ray.
IMPRESSION: Gastric catheter within the distal esophagus. This should be
advanced several cm.
